# Patient Record
Sex: Male | Born: 1960 | Race: White | Hispanic: No | Marital: Married | Smoking: Never smoker
Health system: Southern US, Community
[De-identification: ages and names within clinical notes are randomized; demographics above are authoritative.]

## PROBLEM LIST (undated history)

## (undated) DIAGNOSIS — J309 Allergic rhinitis, unspecified: Secondary | ICD-10-CM

## (undated) DIAGNOSIS — M109 Gout, unspecified: Secondary | ICD-10-CM

## (undated) DIAGNOSIS — G4733 Obstructive sleep apnea (adult) (pediatric): Secondary | ICD-10-CM

## (undated) DIAGNOSIS — E785 Hyperlipidemia, unspecified: Secondary | ICD-10-CM

## (undated) DIAGNOSIS — I10 Essential (primary) hypertension: Secondary | ICD-10-CM

## (undated) DIAGNOSIS — E7800 Pure hypercholesterolemia, unspecified: Secondary | ICD-10-CM

## (undated) HISTORY — DX: Essential (primary) hypertension: I10

## (undated) HISTORY — DX: Gout, unspecified: M10.9

## (undated) HISTORY — DX: Pure hypercholesterolemia, unspecified: E78.00

## (undated) HISTORY — DX: Obstructive sleep apnea (adult) (pediatric): G47.33

## (undated) HISTORY — PX: SHOULDER SURGERY: SHX246

## (undated) HISTORY — DX: Allergic rhinitis, unspecified: J30.9

## (undated) HISTORY — PX: OTHER SURGICAL HISTORY: SHX169

## (undated) HISTORY — DX: Hyperlipidemia, unspecified: E78.5

---

## 2008-06-15 ENCOUNTER — Encounter: Admission: RE | Admit: 2008-06-15 | Discharge: 2008-06-15 | Payer: Self-pay | Admitting: Family Medicine

## 2008-10-09 ENCOUNTER — Ambulatory Visit (HOSPITAL_COMMUNITY): Admission: RE | Admit: 2008-10-09 | Discharge: 2008-10-09 | Payer: Self-pay | Admitting: Specialist

## 2009-01-28 ENCOUNTER — Ambulatory Visit (HOSPITAL_COMMUNITY): Admission: RE | Admit: 2009-01-28 | Discharge: 2009-01-29 | Payer: Self-pay | Admitting: Specialist

## 2010-07-22 IMAGING — CR DG CHEST 2V
2 series · 2 of 2 positions shown · non-contrast
Comparison: None

CLINICAL DATA: Preop workup

CHEST - 2 VIEW

[w chest pa]
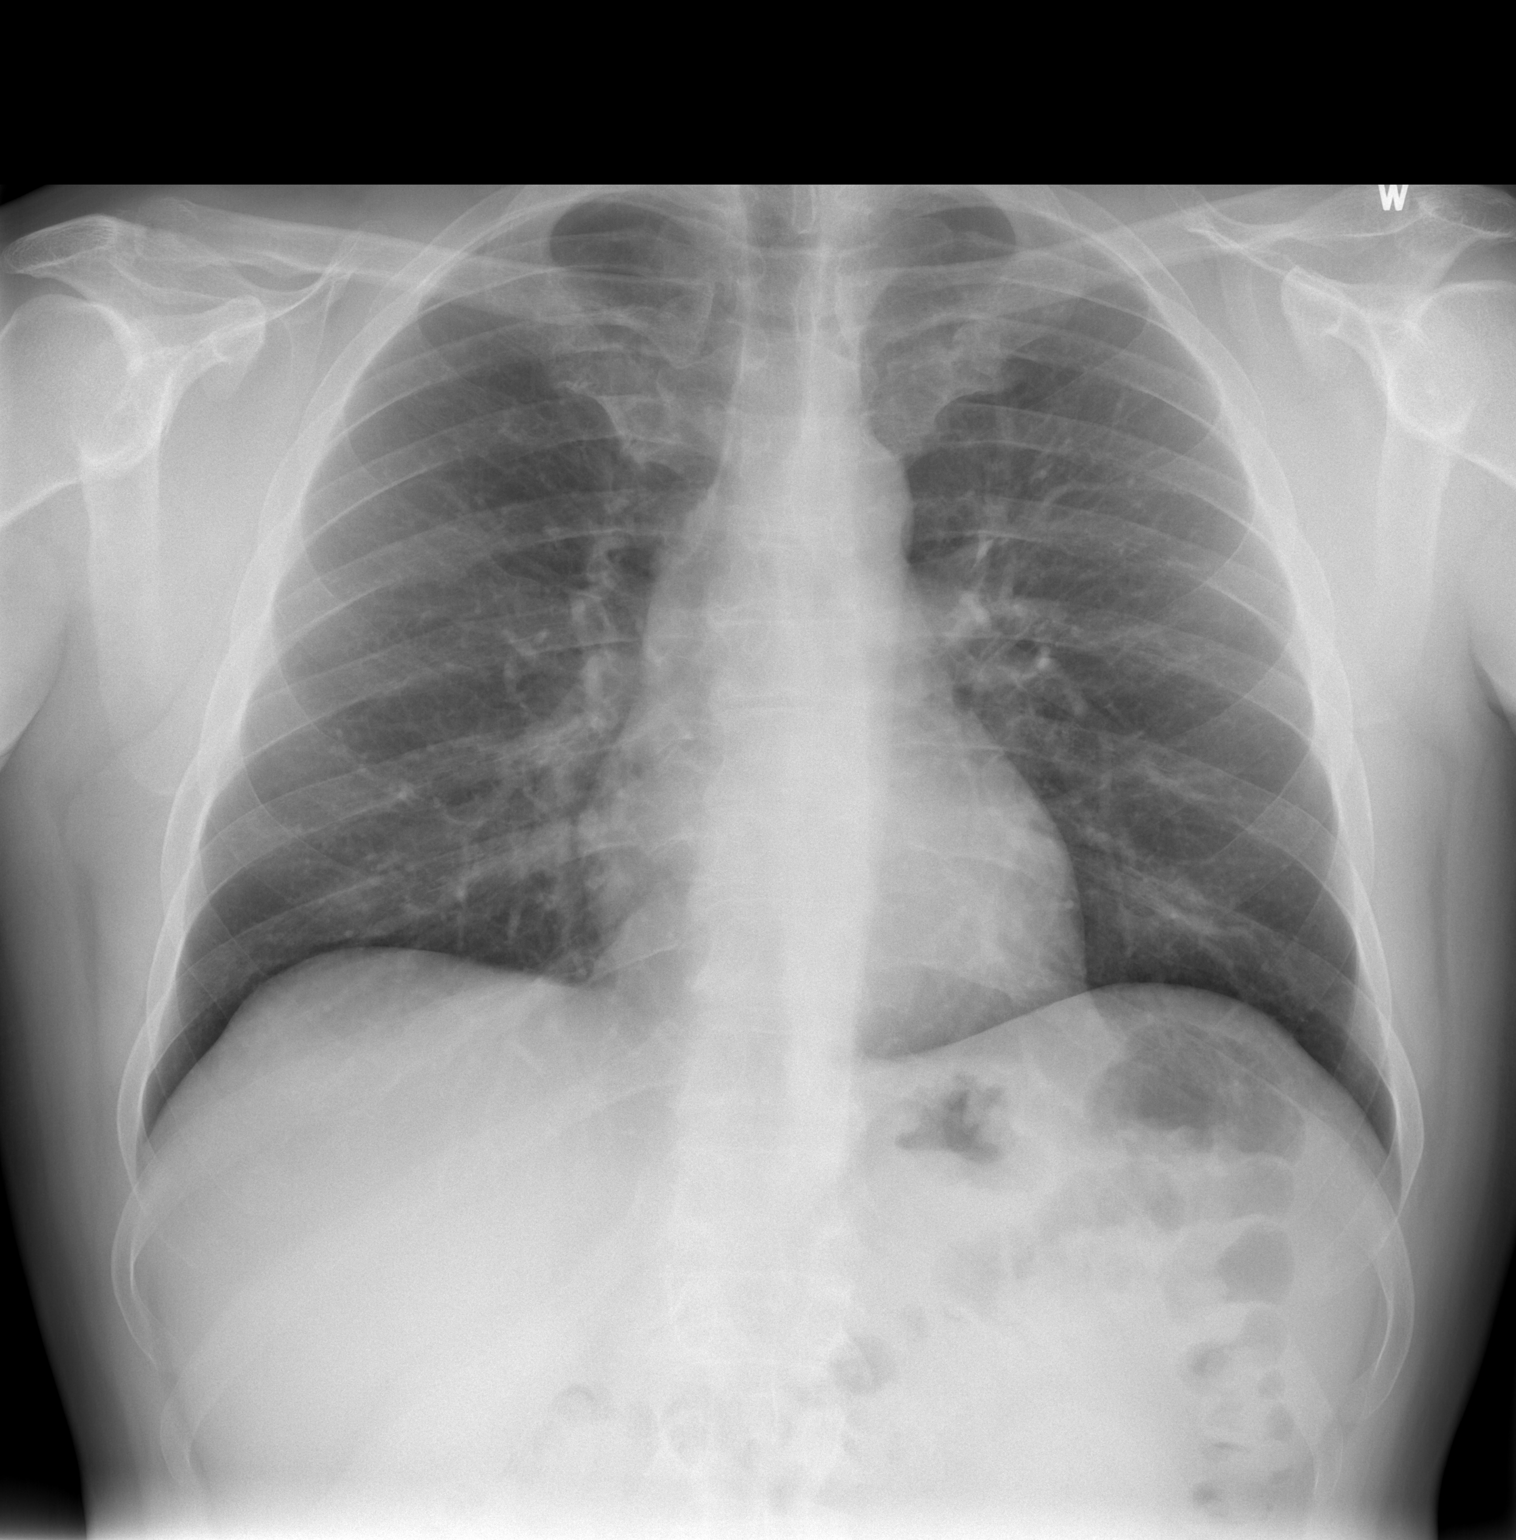

[w chest lat]
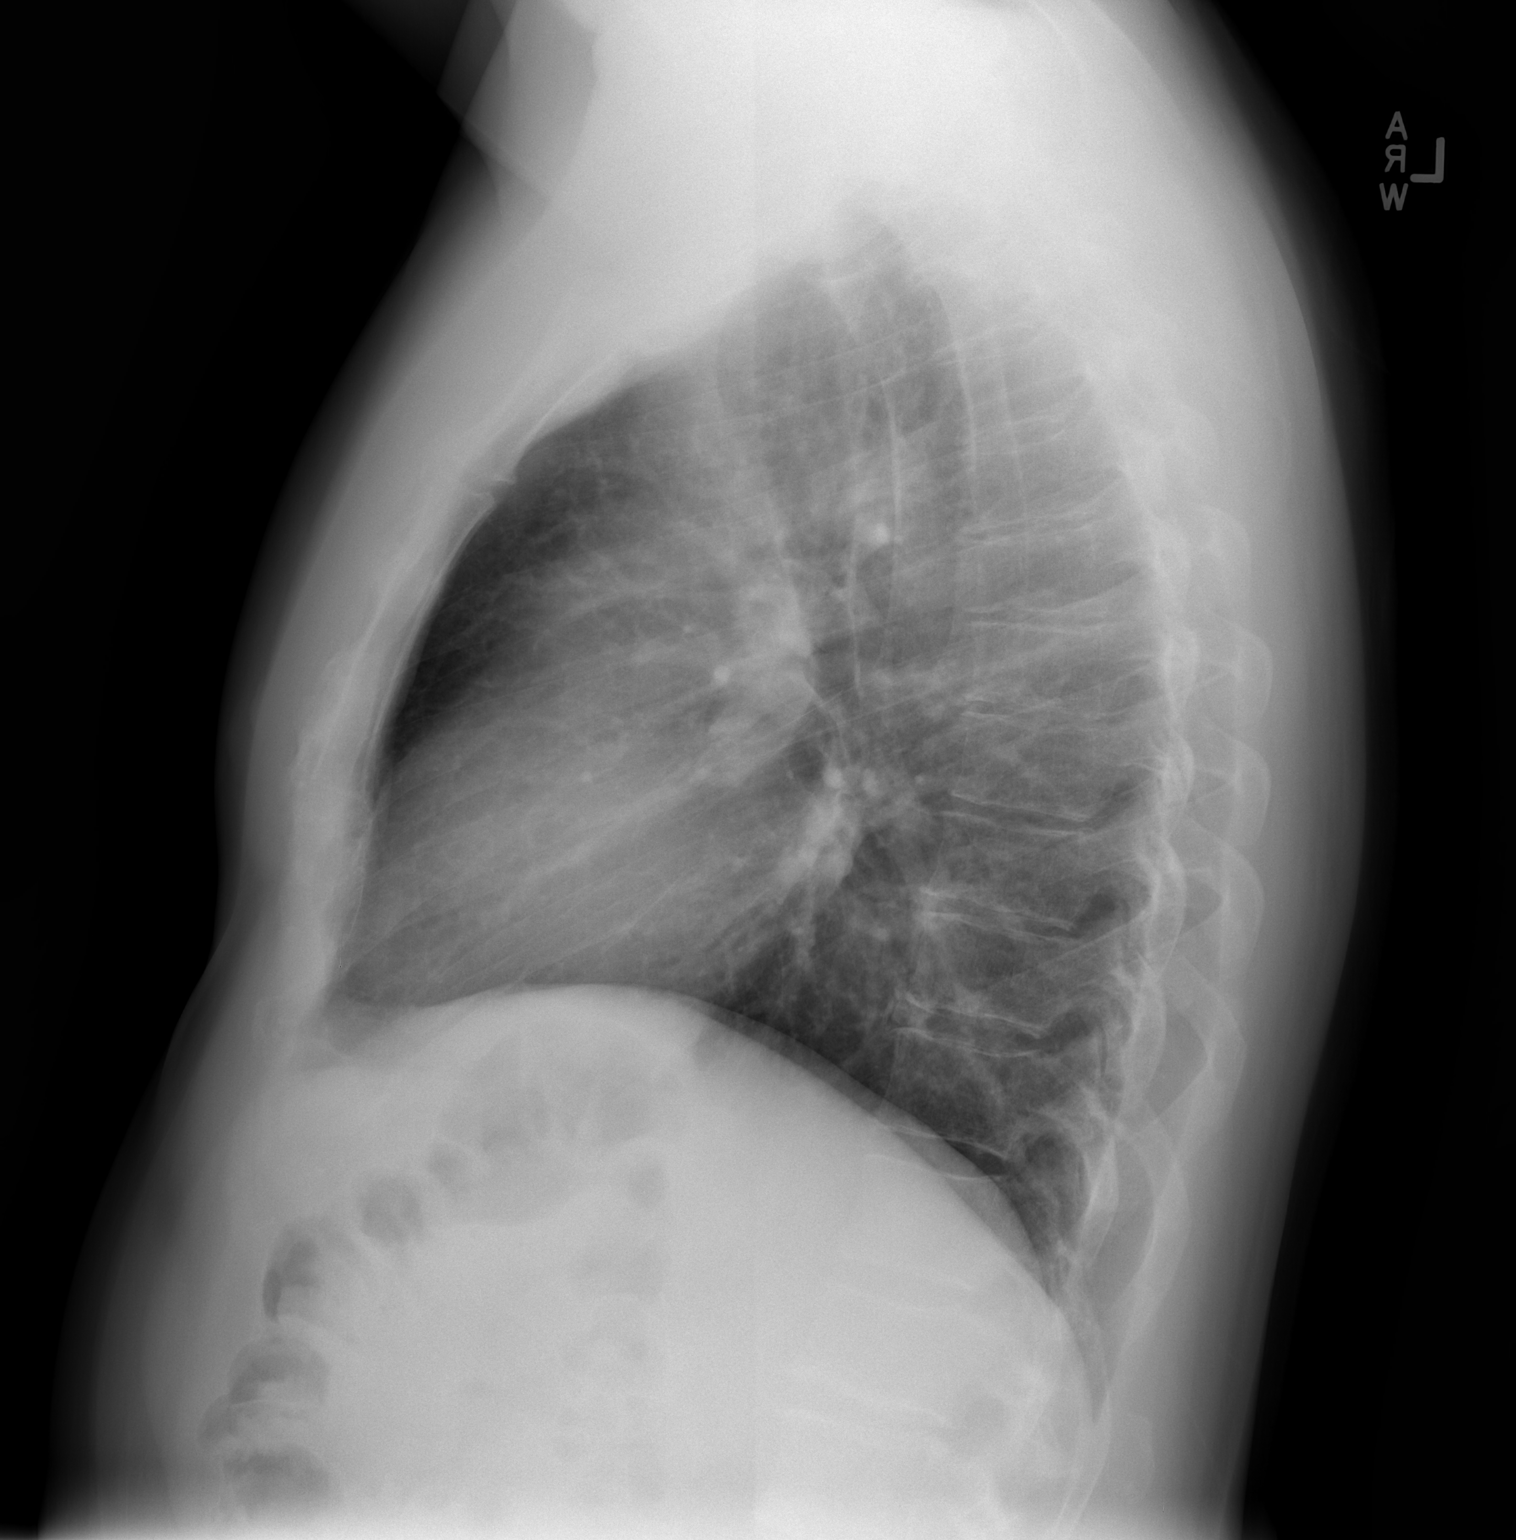

[2 of 2 positions shown; findings below may reference images not displayed]

FINDINGS: The heart size and mediastinal contours are within
normal limits.  Both lungs are clear.  The visualized skeletal
structures are unremarkable.
IMPRESSION: No active cardiopulmonary disease.

## 2010-09-09 LAB — CBC
HCT: 40.3 % (ref 39.0–52.0)
MCHC: 34.8 g/dL (ref 30.0–36.0)
Platelets: 190 10*3/uL (ref 150–400)
RDW: 13.1 % (ref 11.5–15.5)

## 2010-09-09 LAB — BASIC METABOLIC PANEL
BUN: 17 mg/dL (ref 6–23)
Calcium: 9.8 mg/dL (ref 8.4–10.5)
GFR calc non Af Amer: >60 mL/min (ref >60)
Glucose, Bld: 100 mg/dL — ABNORMAL HIGH (ref 70–99)

## 2010-10-17 NOTE — Op Note (Signed)
NAMETYRIEK, HOFMAN NO.:  [id number]   MEDICAL RECORD NO.:  [id number]          PATIENT TYPE:  OIB   LOCATION:  0098                         FACILITY:  [facility]   PHYSICIAN:  Jene Every, M.D.    DATE OF BIRTH:  10/09/1960   DATE OF PROCEDURE:  01/28/2009  DATE OF DISCHARGE:                               OPERATIVE REPORT   PREOPERATIVE DIAGNOSES:  Impingement syndrome, acromioclavicular  arthrosis of the right shoulder.   POSTOPERATIVE DIAGNOSES:  Impingement syndrome, acromioclavicular  arthrosis of the right shoulder.  Glenoid labral tear.   PROCEDURE PERFORMED:  1. Right shoulder arthroscopy.  2. Debridement of glenoid labral tear.  3. Mini open rotator cuff repair of the supraspinatus.  4. Subacromial decompression, bursectomy.  5. Medial and distal clavicle resection.   ANESTHESIA:  General.   ASSISTANT:  Roma Schanz, P.A.   BRIEF HISTORY:  The patient 50 year old with shoulder pain, refractory,  interstitial tear of the rotator cuff, AC arthrosis, refractory to  injections.  Is indicated for arthroscopic evaluation, distal clavicle  resection, possible mini open rotator cuff repair, depending upon  evaluation of the tendon.  Risks and benefits discussed, including  bleeding, infection, injury to vascular structures, suboptimal range of  motion and capsulitis, need for repeat debridement, persistent pain,  etc.   TECHNIQUE:  The patient was brought to the operating room and placed in  beach-chair position.  After induction of adequate general anesthesia, 1  gram of Kefzol, right shoulder and upper extremity were prepped and  draped in the usual sterile fashion.  Full range of motion noted on  exam.  Surgical marker was utilized to delineate the acromion, AC joint  and coracoid.   Standard posterolateral incision was made through the skin and  posterolaterally through the skin only, anterolaterally through the skin  only, and anterior  portal between the coracoid and the acromion, a third  from the anterolateral aspect of the acromion.   With the arm in 07/30 position, we advanced the arthroscopic camera into  the glenohumeral joint, penetrating it atraumatically.  There was  degenerative fraying of the labrum noted.  There was also what appeared  to be tearing of the undersurface of the rotator cuff.  This appeared to  be fairly significant.   We then advanced the cannula under direct visualization beneath the  biceps tendon, penetrating the capsule atraumatically, introduced a  shaver and debrided the labrum to a stable base.  The remainder of the  labrum was intact.  Biceps was intact.  Subscapularis was unremarkable.  Normal cartilage of the glenoid and of the humerus.  Due to what  appeared to be a significant tearing of the rotator cuff, we introduced  and needle from outside the joint through the tendon and through the  tendon.  We then advanced a #1 Prolene through that, removed the needle  and the Prolene was used as an internal and external marker.  I then  redirected the arthroscopic camera into the subacromial space,  triangulating it with a lateral cannula, as well.  In the subacromial  space, hypertrophic and exuberant  bursa was noted and this was debrided  and bursectomy was performed, using an ArthroWand to detach and  morselize the CA ligament.  We used an 18-gauge needle to demarcate the  [facility] joint, and the anterolateral aspect of the acromion.  After  morselizing the CA ligament, we skeletonized the distal clavicle with  the ArthroWand, the acromion in the distal centimeter of that.  We  attempted to deliver it into the subacromial space for arthroscopic  distal clavicle resection, but were unable to do so.  We therefore felt  we would commit to a mini open.   After full bursectomy and inspection, it was felt that there was a  significant, greater than 50% interstitial tear and we decided to   convert to a mini open.  We made a small incision over the anterolateral  aspect of the acromion and made an incision through the raphe.  The  incision was about 1.5 cm.  We subperiosteally elevated the deltoid from  the anterolateral to anteromedial aspect of the acromion.  A small self-  retaining retractor was placed.  We identified the Ethibond suture  through the tendon and removed the suture, marked that point and made an  incision at that point, about 1 cm long, with a 15 blade, debrided the  tendon with the [facility] rongeur and scraped the bone just beneath it with a  curette.  Then we repaired it side-to-side with #1 and 0 Vicryl  interrupted figure-of-eight suture.  Inspection of the remainder of the  subacromial space was essentially unremarkable.   We then made a separate good skin bridge incision over the distal  clavicle, about 1.5 cm in length.  We exposed the distal clavicle, just  incised the capsule over the anterior aspect of it, skeletonized the  remaining portion of distal clavicle, protected that and the tendon.  Using an oscillating saw, we removed 0.5 cm of the distal clavicle.  This was then undercut with a 3-mm Kerrison.  The wound was copiously  irrigated.  We preserved the coracoclavicular ligaments.  We put bone  wax on this clavicle, irrigated and closed the capsule with 0 Vicryl  interrupted figure-of-eight suture and the deltotrapezial fascia with 2-  0 Vicryl simple sutures.  Skin was reapproximated with 4-0 subcuticular  Prolene.   We obtained an intraoperative radiograph due to an end of a suture  needle, which was missing.  It was then found to be in the subacromial  space.  We reinspected the subacromial space.  The needle was  identified, the tip of the needle in its entirety, identical to that  seen on the radiograph and completing the missing portion of the suture  needle.  After this, the wound was copiously irrigated and repaired, the  raphe and the  deltoid with #1 Vicryl interrupted figure-of-eight  sutures, over on top, through the acromion, subcu with 2-0 Vicryl simple  sutures.  Skin was reapproximated with subcuticular Prolene.  Wound  reinforced with Steri-Strips.  Sterile dressing applied.  Portals closed  with 4-0 nylon simple sutures.  Marcaine 0.25% with epinephrine was  infiltrated.  The joint wound was dressed sterilely, placed in an  abduction pillow, extubated without difficulty and transported to the  recovery room in satisfactory condition.   The patient tolerated the procedure with no complications.  Minimal  blood loss.      Jene Every, M.D.  Electronically Signed     JB/MEDQ  D:  01/28/2009  T:  01/28/2009  Job:  [id number]

## 2012-06-16 ENCOUNTER — Other Ambulatory Visit: Payer: Self-pay | Admitting: Family Medicine

## 2012-06-16 DIAGNOSIS — R1310 Dysphagia, unspecified: Secondary | ICD-10-CM

## 2012-06-20 ENCOUNTER — Ambulatory Visit
Admission: RE | Admit: 2012-06-20 | Discharge: 2012-06-20 | Disposition: A | Discharge status: Home / Self Care | Payer: 59 | Source: Ambulatory Visit | Attending: Family Medicine | Admitting: Family Medicine

## 2012-06-20 DIAGNOSIS — R1310 Dysphagia, unspecified: Secondary | ICD-10-CM

## 2013-03-31 ENCOUNTER — Telehealth: Payer: Self-pay | Admitting: Interventional Cardiology

## 2013-03-31 NOTE — Telephone Encounter (Signed)
New problem:  Pt states he is calling for a refill. No answer with out refill team.

## 2013-04-02 ENCOUNTER — Other Ambulatory Visit: Payer: Self-pay

## 2013-04-02 MED ORDER — COLESEVELAM HCL 625 MG PO TABS: 1875.0000 mg | ORAL_TABLET | Freq: Two times a day (BID) | ORAL | Status: DC

## 2013-04-02 MED ORDER — FENOFIBRATE 160 MG PO TABS: 160.0000 mg | ORAL_TABLET | Freq: Every day | ORAL | Status: DC

## 2013-04-02 MED ORDER — COLESEVELAM HCL 625 MG PO TABS: ORAL_TABLET | ORAL | Status: DC

## 2013-04-16 ENCOUNTER — Encounter: Payer: Self-pay | Admitting: *Deleted

## 2013-04-16 ENCOUNTER — Encounter: Payer: Self-pay | Admitting: Interventional Cardiology

## 2013-04-17 ENCOUNTER — Encounter: Payer: Self-pay | Admitting: Interventional Cardiology

## 2013-04-17 ENCOUNTER — Ambulatory Visit (INDEPENDENT_AMBULATORY_CARE_PROVIDER_SITE_OTHER): Payer: 59 | Admitting: Interventional Cardiology

## 2013-04-17 VITALS — BP 125/77 | HR 89 | Ht 69.0 in | Wt 188.4 lb

## 2013-04-17 DIAGNOSIS — I10 Essential (primary) hypertension: Secondary | ICD-10-CM | POA: Insufficient documentation

## 2013-04-17 DIAGNOSIS — E782 Mixed hyperlipidemia: Secondary | ICD-10-CM

## 2013-04-17 DIAGNOSIS — F17200 Nicotine dependence, unspecified, uncomplicated: Secondary | ICD-10-CM

## 2013-04-17 MED ORDER — COLESEVELAM HCL 625 MG PO TABS: ORAL_TABLET | ORAL | Status: DC

## 2013-04-17 MED ORDER — FENOFIBRATE 160 MG PO TABS: 160.0000 mg | ORAL_TABLET | Freq: Every day | ORAL | Status: DC

## 2013-04-17 NOTE — Patient Instructions (Signed)
Your physician discussed the importance of regular exercise and recommended that you start or continue a regular exercise program for good health. Try to exercise 5 days a week for at least 30 minutes.  Try to decrease alcohol intake as much as possible. No more than one 6 pack a week.  Your physician wants you to follow-up in: 1 year with Dr. Eldridge Dace. You will receive a reminder letter in the mail two months in advance. If you don't receive a letter, please call our office to schedule the follow-up appointment.

## 2013-04-17 NOTE — Progress Notes (Signed)
Patient ID: Martin Sims, male   DOB: [birth year], 52 y.o.   MRN: [id number]    [address] [place], [place]  [address] Phone: [phone] Fax:  [phone]  Date:  04/17/2013   ID:  Martin Sims, DOB [birth year], MRN [id number]  PCP:  No primary provider on file.      History of Present Illness: Martin Sims is a 52 y.o. male who has had hyperlipidemia and HTN. He has not been exercising regularly. His most strenuous activity going up stairs. No problems with this.  Occasionaly goes to the gym and does cardio and weghts wihtout a problem.  He uses CPAP regularly. Hypertension:  Denies : Chest pain.  Dizziness.  Leg edema.  Orthopnea.  Paroxysmal nocturnal dyspnea.  Palpitations.  Syncope.     Wt Readings from Last 3 Encounters:  04/17/13 188 lb 6.4 oz (85.458 kg)     Past Medical History  Diagnosis Date  . Gout   . Hyperlipidemia   . HTN (hypertension)   . Hypercholesteremia   . OSA (obstructive sleep apnea)     PSG 07/24/09 ESS 13, AHI 34/hr REM 29/hr, RDI 34/hr REM 29/hr, O2 min 84%; CPAP 08/17/09 CPAP 7 with AHI 0)  . Allergic rhinitis     Current Outpatient Prescriptions  Medication Sig Dispense Refill  . amLODipine (NORVASC) 5 MG tablet Take 5 mg by mouth daily.       . colesevelam (WELCHOL) 625 MG tablet Take 2 tablets by mouth twice a day  120 tablet  0  . fenofibrate 160 MG tablet Take 1 tablet (160 mg total) by mouth daily.  30 tablet  3  . losartan (COZAAR) 50 MG tablet Take 50 mg by mouth daily.       . niacin (NIASPAN) 1000 MG CR tablet Take 1,000 mg by mouth at bedtime.       [name] Kitchen omega-3 acid ethyl esters (LOVAZA) 1 G capsule Take 1 g by mouth daily.        No current facility-administered medications for this visit.    Allergies:   No Known Allergies  Social History:  The patient  reports that he has never smoked. He does not have any smokeless tobacco history on file. He reports that he does not use illicit drugs.   Family  History:  The patient's family history includes CAD in his father.   ROS:  Please see the history of present illness.  No nausea, vomiting.  No fevers, chills.  No focal weakness.  No dysuria.    All other systems reviewed and negative.   PHYSICAL EXAM: VS:  BP 125/77  Pulse 89  Ht 5\' 9"  (1.753 m)  Wt 188 lb 6.4 oz (85.458 kg)  BMI 27.81 kg/m2 Well nourished, well developed, in no acute distress HEENT: normal Neck: no JVD, no carotid bruits Cardiac:  normal S1, S2; RRR;  Lungs:  clear to auscultation bilaterally, no wheezing, rhonchi or rales Abd: soft, nontender, no hepatomegaly Ext: no edema Skin: warm and dry Neuro:   no focal abnormalities noted  EKG:  Sinus bradycardia, no significant ST changes; LAD     ASSESSMENT AND PLAN:  Essential hypertension, benign  Continue Losartan Potassium Tablet, 50 MG, 1 tablet, Orally, once a day Continue Amlodipine Besylate tablet, 5MG , 1 tablet, Orally, once a day BP controlled at home and work.    2. Mixed hyperlipidemia  Continue Fenofibrate Tablet, 160 MG, 1 tablet, Orally, Once a day  Continue Lovaza Capsule, 1GM, 2 capsules, Orally, Twice a day Continue WelChol Tablet, 625 MG, 2 tablets, Orally, twice a day Continue Niaspan Tablet Extended Release, 1000 MG, 2 tablets at bedtime, Orally, at bedtime Intolerant of statins due to increased LFTs. Lipids checked today.  Asked him to cut down on his alcohol intake. Currently, he drinks 12-18 beers a week. He is to cut down to a maximum of 6 per week. I wonder if some of the elevated LFTs that he had in the past with statins was also due to alcohol consumption.  Discussed with [employer], Pharm.D. Labs to be rechecked in about 2 months.    3. tobacco abuse: I recommend that he stop using chewing tobacco. He is trying to quit.    Diagnostic Imaging  EKG Harward,Sims 04/15/2012 08:52:29 AM > Sims,Martin 04/15/2012 09:07:32 AM > NSR, LAFB, no ST segment changes. RBBB from prior ECG has  resolved.   Preventive Medicine  Adult topics discussed:  Diet: healthy diet, discussed a low fat, low cholesterol, low sugar diet.  Exercise: at least 30 minutes of aerobic exercise, 5 days a week.   avoid chewing tobacco.      Signed, Martin Mare, MD, [facility] 04/17/2013 11:47 AM

## 2013-04-27 ENCOUNTER — Other Ambulatory Visit: Payer: Self-pay | Admitting: *Deleted

## 2013-04-27 DIAGNOSIS — Z79899 Other long term (current) drug therapy: Secondary | ICD-10-CM

## 2013-04-27 DIAGNOSIS — E782 Mixed hyperlipidemia: Secondary | ICD-10-CM

## 2013-06-08 ENCOUNTER — Other Ambulatory Visit: Payer: 59

## 2013-06-17 ENCOUNTER — Other Ambulatory Visit (INDEPENDENT_AMBULATORY_CARE_PROVIDER_SITE_OTHER): Payer: 59

## 2013-06-17 DIAGNOSIS — Z79899 Other long term (current) drug therapy: Secondary | ICD-10-CM

## 2013-06-17 DIAGNOSIS — E782 Mixed hyperlipidemia: Secondary | ICD-10-CM

## 2013-06-17 LAB — ALT: ALT: 30 U/L (ref 0–53)

## 2013-06-18 LAB — NMR LIPOPROFILE WITH LIPIDS
CHOLESTEROL, TOTAL: 175 mg/dL (ref <200)
HDL PARTICLE NUMBER: 36.6 umol/L (ref >=30.5)
HDL Size: 8.4 nm — ABNORMAL LOW (ref >=9.2)
HDL-C: 44 mg/dL (ref >=40)
LARGE VLDL-P: 9.5 nmol/L — AB (ref <=2.7)
LDL (calc): 79 mg/dL (ref <100)
LDL PARTICLE NUMBER: 1628 nmol/L — AB (ref <1000)
LDL Size: 19.8 nm — ABNORMAL LOW (ref >20.5)
LP-IR SCORE: 84 — AB (ref <=45)
Large HDL-P: 1.7 umol/L — ABNORMAL LOW (ref >=4.8)
Small LDL Particle Number: 1231 nmol/L — ABNORMAL HIGH (ref <=527)
TRIGLYCERIDES: 260 mg/dL — AB (ref <150)
VLDL SIZE: 52 nm — AB (ref <=46.6)

## 2013-06-25 ENCOUNTER — Telehealth: Payer: Self-pay | Admitting: Pharmacist

## 2013-06-25 DIAGNOSIS — Z79899 Other long term (current) drug therapy: Secondary | ICD-10-CM

## 2013-06-25 DIAGNOSIS — E785 Hyperlipidemia, unspecified: Secondary | ICD-10-CM

## 2013-06-25 MED ORDER — ROSUVASTATIN CALCIUM 10 MG PO TABS: ORAL_TABLET | ORAL | Status: DC

## 2013-06-25 NOTE — Telephone Encounter (Signed)
Discussed his lipid panel from this month with him over the phone:  RF: Family h/o CAD, skoal use (tobacco), HTN, age - LDL goal < 130 at least, LDL-P goal < 1300.  Meds: Fenofibrate 160 mg qd, Niaspan 2000 mg qhs, Lovaza 4 g/d, Welchol 625 mg 4 tablets daily, CholestOff 2 per day.  Avoding statins due to h/o LFT elevations, and failed Zetia  In past (muscle aches in his amrs).  Patient uses snuff and drinks 12-18 beers per week. Not using statins in patient given h/o LFT elevation, however LFTs have been normal for years.  Cholesterol trending up on this regimen. I discussed options with patient, and since cholesterol worsening, and LFTs normal, will try to add Crestor 10 mg twice weekly and stop Welchol.  Welchol may be contributing to his elevated TG, which could be leading to elevated LDL-P.  His beer consumption could also be doing this. He is taking fenofibrate without food currently. Plan: 1.  Patient notified to continue Lovaza 4 g/d and niaspan 2 g qhs. 2.  Patient to add Crestor 10 mg twice weekly.  Starting low dose given h/o elevated LFTs 3.  He will also move his fenofibrate to his largest meal of the day to increase absorption. 4.  He will take his Cholestoff twice daily prior to meals. 5.  Recheck NMR LipoProfile and hepatic panel in 2 months (09/09/13).  If cholesterol no better I will call and have him come in to see me in clinic.  To Dr. Irish Lack as FYI only.

## 2013-06-30 ENCOUNTER — Telehealth: Payer: Self-pay | Admitting: *Deleted

## 2013-06-30 NOTE — Telephone Encounter (Signed)
PA to [employer] for crestor

## 2013-07-09 NOTE — Telephone Encounter (Signed)
[facility] approved Crestor for 2 years, patient notified via message on 2 phones.

## 2013-08-17 ENCOUNTER — Other Ambulatory Visit: Payer: Self-pay

## 2013-08-17 MED ORDER — OMEGA-3-ACID ETHYL ESTERS 1 G PO CAPS: 2.0000 g | ORAL_CAPSULE | Freq: Two times a day (BID) | ORAL | Status: DC

## 2013-09-09 ENCOUNTER — Other Ambulatory Visit: Payer: 59

## 2013-09-17 NOTE — Telephone Encounter (Signed)
Pt was seen 11/14 and refills were addressed.

## 2013-09-30 ENCOUNTER — Other Ambulatory Visit (INDEPENDENT_AMBULATORY_CARE_PROVIDER_SITE_OTHER): Payer: 59

## 2013-09-30 DIAGNOSIS — Z79899 Other long term (current) drug therapy: Secondary | ICD-10-CM

## 2013-09-30 DIAGNOSIS — E785 Hyperlipidemia, unspecified: Secondary | ICD-10-CM

## 2013-09-30 LAB — HEPATIC FUNCTION PANEL
ALBUMIN: 4 g/dL (ref 3.5–5.2)
ALT: 32 U/L (ref 0–53)
AST: 30 U/L (ref 0–37)
Alkaline Phosphatase: 33 U/L — ABNORMAL LOW (ref 39–117)
Bilirubin, Direct: 0.1 mg/dL (ref 0.0–0.3)
Total Bilirubin: 0.6 mg/dL (ref 0.3–1.2)
Total Protein: 6.6 g/dL (ref 6.0–8.3)

## 2013-10-01 LAB — NMR LIPOPROFILE WITH LIPIDS
Cholesterol, Total: 131 mg/dL (ref <200)
HDL Particle Number: 37.6 umol/L (ref >=30.5)
HDL Size: 8.2 nm — ABNORMAL LOW (ref >=9.2)
HDL-C: 47 mg/dL (ref >=40)
LDL CALC: 48 mg/dL (ref <100)
LDL PARTICLE NUMBER: 1212 nmol/L — AB (ref <1000)
LDL SIZE: 19.6 nm — AB (ref >20.5)
LP-IR SCORE: 80 — AB (ref <=45)
Large HDL-P: <1.3 umol/L — ABNORMAL LOW (ref >=4.8)
Large VLDL-P: 6.2 nmol/L — ABNORMAL HIGH (ref <=2.7)
SMALL LDL PARTICLE NUMBER: 1015 nmol/L — AB (ref <=527)
Triglycerides: 181 mg/dL — ABNORMAL HIGH (ref <150)
VLDL SIZE: 51.2 nm — AB (ref <=46.6)

## 2013-10-06 ENCOUNTER — Other Ambulatory Visit: Payer: Self-pay | Admitting: Cardiology

## 2013-10-06 DIAGNOSIS — E782 Mixed hyperlipidemia: Secondary | ICD-10-CM

## 2013-10-06 MED ORDER — NIACIN ER (ANTIHYPERLIPIDEMIC) 1000 MG PO TBCR: 2000.0000 mg | EXTENDED_RELEASE_TABLET | Freq: Every day | ORAL | Status: DC

## 2013-10-06 MED ORDER — ROSUVASTATIN CALCIUM 10 MG PO TABS: ORAL_TABLET | ORAL | Status: DC

## 2013-12-08 ENCOUNTER — Other Ambulatory Visit: Payer: Self-pay | Admitting: *Deleted

## 2013-12-08 MED ORDER — FENOFIBRATE 160 MG PO TABS: 160.0000 mg | ORAL_TABLET | Freq: Every day | ORAL | Status: DC

## 2014-04-08 ENCOUNTER — Other Ambulatory Visit: Payer: 59

## 2014-05-06 ENCOUNTER — Other Ambulatory Visit (INDEPENDENT_AMBULATORY_CARE_PROVIDER_SITE_OTHER): Payer: 59 | Admitting: *Deleted

## 2014-05-06 ENCOUNTER — Ambulatory Visit (INDEPENDENT_AMBULATORY_CARE_PROVIDER_SITE_OTHER): Payer: 59 | Admitting: Interventional Cardiology

## 2014-05-06 ENCOUNTER — Encounter: Payer: Self-pay | Admitting: Interventional Cardiology

## 2014-05-06 VITALS — BP 138/90 | HR 72 | Ht 69.0 in | Wt 191.0 lb

## 2014-05-06 DIAGNOSIS — E782 Mixed hyperlipidemia: Secondary | ICD-10-CM

## 2014-05-06 DIAGNOSIS — I10 Essential (primary) hypertension: Secondary | ICD-10-CM

## 2014-05-06 LAB — HEPATIC FUNCTION PANEL
ALT: 45 U/L (ref 0–53)
AST: 40 U/L — AB (ref 0–37)
Albumin: 4.1 g/dL (ref 3.5–5.2)
Alkaline Phosphatase: 40 U/L (ref 39–117)
Bilirubin, Direct: 0 mg/dL (ref 0.0–0.3)
TOTAL PROTEIN: 6.6 g/dL (ref 6.0–8.3)
Total Bilirubin: 0.9 mg/dL (ref 0.2–1.2)

## 2014-05-06 NOTE — Patient Instructions (Signed)
Your physician recommends that you continue on your current medications as directed. Please refer to the Current Medication list given to you today.  Your physician wants you to follow-up in: 1 year with Dr. Varanasi. You will receive a reminder letter in the mail two months in advance. If you don't receive a letter, please call our office to schedule the follow-up appointment.  

## 2014-05-06 NOTE — Progress Notes (Signed)
Patient ID: Martin Sims, male   DOB: 1960-12-27, 53 y.o.   MRN: [id number] Patient ID: Martin Sims, male   DOB: 1961/01/02, 53 y.o.   MRN: [id number]    [place], [place], [place]  [address] Phone: [phone] Fax:  [phone]  Date:  05/06/2014   ID:  Martin Sims, DOB [birth year], MRN [id number]  PCP:  Marjorie Smolder, MD      History of Present Illness: Martin Sims is a 53 y.o. male who has had hyperlipidemia and HTN. He has not been exercising regularly recently due to a cold in November 2015.  Prior to this, he was doing more exercise.  He changed to dayshift but still has not gotten into a regular routine.  He works in [place]. His most strenuous activity going up stairs. No problems with this. Previously, he would go to the gym and do cardio and weights wihtout a problem.   He plans to restart exercise He uses CPAP regularly and tolerates it well. Hypertension:  Denies : Chest pain.  Dizziness.  Leg edema.  Orthopnea.  Paroxysmal nocturnal dyspnea.  Palpitations.  Syncope.     Wt Readings from Last 3 Encounters:  05/06/14 191 lb (86.637 kg)  04/17/13 188 lb 6.4 oz (85.458 kg)     Past Medical History  Diagnosis Date  . Gout   . Hyperlipidemia   . HTN (hypertension)   . Hypercholesteremia   . OSA (obstructive sleep apnea)     PSG 07/24/09 ESS 13, AHI 34/hr REM 29/hr, RDI 34/hr REM 29/hr, O2 min 84%; CPAP 08/17/09 CPAP 7 with AHI 0)  . Allergic rhinitis     Current Outpatient Prescriptions  Medication Sig Dispense Refill  . amLODipine (NORVASC) 5 MG tablet Take 5 mg by mouth daily.     . fenofibrate 160 MG tablet Take 1 tablet (160 mg total) by mouth daily. 14 tablet 0  . losartan (COZAAR) 50 MG tablet Take 50 mg by mouth daily.     . niacin (NIASPAN) 1000 MG CR tablet Take 2 tablets (2,000 mg total) by mouth at bedtime. 180 tablet 2  . omega-3 acid ethyl esters (LOVAZA) 1 G capsule Take 2 capsules (2 g total) by mouth 2 (two) times  daily. 30 capsule 0  . rosuvastatin (CRESTOR) 10 MG tablet Take 1 tablet twice weekly 30 tablet 3   No current facility-administered medications for this visit.    Allergies:   No Known Allergies  Social History:  The patient  reports that he has never smoked. His smokeless tobacco use includes Chew. He reports that he drinks alcohol. He reports that he does not use illicit drugs.   Family History:  The patient's family history includes CAD in his father.   ROS:  Please see the history of present illness.  No nausea, vomiting.  No fevers, chills.  No focal weakness.  No dysuria.    All other systems reviewed and negative.   PHYSICAL EXAM: VS:  BP 138/90 mmHg  Pulse 72  Ht 5\' 9"  (1.753 m)  Wt 191 lb (86.637 kg)  BMI 28.19 kg/m2 Well nourished, well developed, in no acute distress HEENT: normal Neck: no JVD, no carotid bruits Cardiac:  normal S1, S2; RRR;  Lungs:  clear to auscultation bilaterally, no wheezing, rhonchi or rales Abd: soft, nontender, no hepatomegaly Ext: no edema Skin: warm and dry Neuro:   no focal abnormalities noted Psych: normal affect  EKG:  Sinus  bradycardia, no significant ST changes; LAD     ASSESSMENT AND PLAN:  Essential hypertension, benign  Continue Losartan Potassium Tablet, 50 MG, 1 tablet, Orally, once a day Continue Amlodipine Besylate tablet, 5MG , 1 tablet, Orally, once a day BP controlled at home and work.    2. Mixed hyperlipidemia  Continue Fenofibrate Tablet, 160 MG, 1 tablet, Orally, Once a day Continue Lovaza Capsule, 1GM, 2 capsules, Orally, Twice a day Stopped WelChol Tablet, 625 MG, 2 tablets, Orally, twice a day Continue Niaspan Tablet Extended Release, 1000 MG, 2 tablets at bedtime, Orally, at bedtime Intolerant of statins in the past due to increased LFTs.  Now tolerating twice a week Crestor. Lipids checked today.  Asked him to cut down on his alcohol intake.He is to cut down to a maximum of 6 per week. I wonder if some of the  elevated LFTs that he had in the past with statins was also due to alcohol consumption.     3. tobacco abuse: I recommend that he stop using chewing tobacco. He is trying to quit.         Preventive Medicine  Adult topics discussed:  Diet: healthy diet, discussed a low fat, low cholesterol, low sugar diet.  Exercise: at least 30 minutes of aerobic exercise, 5 days a week.   avoid chewing tobacco.      Signed, Mina Marble, MD, [facility] 05/06/2014 9:28 AM

## 2014-05-08 LAB — NMR LIPOPROFILE WITH LIPIDS
Cholesterol, Total: 186 mg/dL (ref 100–199)
HDL PARTICLE NUMBER: 37.1 umol/L (ref >=30.5)
HDL Size: 8.6 nm — ABNORMAL LOW (ref >=9.2)
HDL-C: 38 mg/dL — ABNORMAL LOW (ref >39)
LARGE HDL: 1.6 umol/L — AB (ref >=4.8)
LDL (calc): 97 mg/dL (ref 0–99)
LDL Particle Number: 1903 nmol/L — ABNORMAL HIGH (ref <1000)
LDL SIZE: 19.8 nm (ref >=20.8)
LP-IR Score: 79 — ABNORMAL HIGH (ref <=45)
Large VLDL-P: 7.9 nmol/L — ABNORMAL HIGH (ref <=2.7)
Small LDL Particle Number: 1458 nmol/L — ABNORMAL HIGH (ref <=527)
Triglycerides: 254 mg/dL — ABNORMAL HIGH (ref 0–149)
VLDL Size: 48.3 nm — ABNORMAL HIGH (ref <=46.6)

## 2014-05-24 ENCOUNTER — Other Ambulatory Visit: Payer: Self-pay

## 2014-05-24 DIAGNOSIS — E782 Mixed hyperlipidemia: Secondary | ICD-10-CM

## 2014-06-14 ENCOUNTER — Other Ambulatory Visit: Payer: Self-pay | Admitting: *Deleted

## 2014-06-14 ENCOUNTER — Telehealth: Payer: Self-pay | Admitting: Interventional Cardiology

## 2014-06-14 MED ORDER — ROSUVASTATIN CALCIUM 10 MG PO TABS: ORAL_TABLET | ORAL | Status: DC

## 2014-06-14 NOTE — Telephone Encounter (Signed)
Left voicemail on home phone letting pt know that prescription was sent to pharmacy.

## 2014-06-14 NOTE — Telephone Encounter (Signed)
New Msg        Pt calling to see if he can get a 90 day prescription written for Crestor.    [facility] [place]    Please call pt if any concerns.

## 2014-07-14 ENCOUNTER — Other Ambulatory Visit: Payer: Self-pay

## 2014-07-14 MED ORDER — FENOFIBRATE 160 MG PO TABS: 160.0000 mg | ORAL_TABLET | Freq: Every day | ORAL | Status: DC

## 2014-07-14 MED ORDER — NIACIN ER (ANTIHYPERLIPIDEMIC) 1000 MG PO TBCR: 2000.0000 mg | EXTENDED_RELEASE_TABLET | Freq: Every day | ORAL | Status: DC

## 2014-07-14 MED ORDER — OMEGA-3-ACID ETHYL ESTERS 1 G PO CAPS: 2.0000 g | ORAL_CAPSULE | Freq: Two times a day (BID) | ORAL | Status: DC

## 2014-11-17 ENCOUNTER — Other Ambulatory Visit: Payer: 59

## 2014-11-26 ENCOUNTER — Other Ambulatory Visit (INDEPENDENT_AMBULATORY_CARE_PROVIDER_SITE_OTHER): Payer: 59 | Admitting: *Deleted

## 2014-11-26 DIAGNOSIS — E782 Mixed hyperlipidemia: Secondary | ICD-10-CM

## 2014-11-26 LAB — HEPATIC FUNCTION PANEL
ALT: 32 U/L (ref 0–53)
AST: 30 U/L (ref 0–37)
Albumin: 4.3 g/dL (ref 3.5–5.2)
Alkaline Phosphatase: 38 U/L — ABNORMAL LOW (ref 39–117)
BILIRUBIN TOTAL: 0.7 mg/dL (ref 0.2–1.2)
Bilirubin, Direct: 0.1 mg/dL (ref 0.0–0.3)
TOTAL PROTEIN: 6.8 g/dL (ref 6.0–8.3)

## 2014-11-29 LAB — NMR LIPOPROFILE WITH LIPIDS
Cholesterol, Total: 130 mg/dL (ref 100–199)
HDL Particle Number: 32.8 umol/L (ref >=30.5)
HDL Size: 8.3 nm — ABNORMAL LOW (ref >=9.2)
HDL-C: 37 mg/dL — AB (ref >39)
LDL (calc): 55 mg/dL (ref 0–99)
LDL PARTICLE NUMBER: 1109 nmol/L — AB (ref <1000)
LDL SIZE: 19.9 nm (ref >=20.8)
LP-IR Score: 75 — ABNORMAL HIGH (ref <=45)
Large VLDL-P: 5.5 nmol/L — ABNORMAL HIGH (ref <=2.7)
Small LDL Particle Number: 760 nmol/L — ABNORMAL HIGH (ref <=527)
Triglycerides: 191 mg/dL — ABNORMAL HIGH (ref 0–149)
VLDL Size: 47.9 nm — ABNORMAL HIGH (ref <=46.6)

## 2015-02-25 ENCOUNTER — Encounter: Payer: Self-pay | Admitting: Interventional Cardiology

## 2015-06-01 ENCOUNTER — Other Ambulatory Visit: Payer: Self-pay | Admitting: Interventional Cardiology

## 2015-07-06 ENCOUNTER — Encounter: Payer: Self-pay | Admitting: Interventional Cardiology

## 2015-07-15 ENCOUNTER — Other Ambulatory Visit (INDEPENDENT_AMBULATORY_CARE_PROVIDER_SITE_OTHER): Payer: 59 | Admitting: *Deleted

## 2015-07-15 DIAGNOSIS — E782 Mixed hyperlipidemia: Secondary | ICD-10-CM

## 2015-07-15 DIAGNOSIS — F17200 Nicotine dependence, unspecified, uncomplicated: Secondary | ICD-10-CM

## 2015-07-15 DIAGNOSIS — I10 Essential (primary) hypertension: Secondary | ICD-10-CM

## 2015-07-15 LAB — HEPATIC FUNCTION PANEL
ALBUMIN: 4.2 g/dL (ref 3.6–5.1)
ALT: 35 U/L (ref 9–46)
AST: 32 U/L (ref 10–35)
Alkaline Phosphatase: 36 U/L — ABNORMAL LOW (ref 40–115)
BILIRUBIN DIRECT: 0.2 mg/dL (ref <=0.2)
BILIRUBIN TOTAL: 0.8 mg/dL (ref 0.2–1.2)
Indirect Bilirubin: 0.6 mg/dL (ref 0.2–1.2)
Total Protein: 6.8 g/dL (ref 6.1–8.1)

## 2015-07-15 NOTE — Progress Notes (Signed)
Patient ID: Martin Sims, male   DOB: [birth year], 55 y.o.   MRN: [id number]     Cardiology Office Note   Date:  07/18/2015   ID:  Martin Sims, DOB 06-22-1960, MRN [id number]  PCP:  Martin Smolder, MD    No chief complaint on file.    Wt Readings from Last 3 Encounters:  07/18/15 189 lb (85.73 kg)  05/06/14 191 lb (86.637 kg)  04/17/13 188 lb 6.4 oz (85.458 kg)       History of Present Illness: Martin Sims is a 55 y.o. male  who has had hyperlipidemia and HTN. He has not been exercising regularly in 2017 due to some personal circumstances.  He changed to dayshift but then went back to second shift due to traffic. He works in [place]. His most strenuous activity going up stairs. No problems with this.Not going to the gym and doing cardio and weights like he did before. He plans to restart exercise He uses CPAP regularly and tolerates it well. Hypertension:  Denies : Chest pain.  Dizziness.  Leg edema.  Orthopnea.  Paroxysmal nocturnal dyspnea.  Palpitations.  Syncope.   Overall, he feels that he is ok, but too inactive.    Past Medical History  Diagnosis Date  . Gout   . Hyperlipidemia   . HTN (hypertension)   . Hypercholesteremia   . OSA (obstructive sleep apnea)     PSG 07/24/09 ESS 13, AHI 34/hr REM 29/hr, RDI 34/hr REM 29/hr, O2 min 84%; CPAP 08/17/09 CPAP 7 with AHI 0)  . Allergic rhinitis     Past Surgical History  Procedure Laterality Date  . Shoulder surgery    . Septoplasty and bilateral turbinate reduction- dr. Janace Hoard       Current Outpatient Prescriptions  Medication Sig Dispense Refill  . amLODipine (NORVASC) 5 MG tablet Take 5 mg by mouth daily.     . fenofibrate 160 MG tablet Take 1 tablet (160 mg total) by mouth daily. 90 tablet 3  . losartan (COZAAR) 50 MG tablet Take 50 mg by mouth daily.     . niacin (NIASPAN) 1000 MG CR tablet TAKE 2 TABLETS BY MOUTH AT BEDTIME 60 tablet 2  . omega-3 acid ethyl esters (LOVAZA) 1 g capsule  TAKE 2 CAPSULES BY MOUTH TWICE A DAY 120 capsule 2  . rosuvastatin (CRESTOR) 10 MG tablet Take 1 tablet twice weekly 30 tablet 3   No current facility-administered medications for this visit.    Allergies:   Review of patient's allergies indicates no known allergies.    Social History:  The patient  reports that he has never smoked. His smokeless tobacco use includes Chew. He reports that he drinks alcohol. He reports that he does not use illicit drugs.   Family History:  The patient's family history includes CAD in his father.    ROS:  Please see the history of present illness.   Otherwise, review of systems are positive for perceived weight gain.   All other systems are reviewed and negative.    PHYSICAL EXAM: VS:  BP 125/80 mmHg  Pulse 64  Ht 5\' 9"  (1.753 m)  Wt 189 lb (85.73 kg)  BMI 27.90 kg/m2 , BMI Body mass index is 27.9 kg/(m^2). GEN: Well nourished, well developed, in no acute distress HEENT: normal Neck: no JVD, carotid bruits, or masses Cardiac: RRR; no murmurs, rubs, or gallops,no edema  Respiratory:  clear to auscultation bilaterally, normal work of breathing GI: soft, nontender, nondistended, +  BS MS: no deformity or atrophy Skin: warm and dry, no rash Neuro:  Strength and sensation are intact Psych: euthymic mood, full affect   EKG:   The ekg ordered today demonstrates normal sinus rhythm, right bundle branch block   Recent Labs: 07/15/2015: ALT 35   Lipid Panel    Component Value Date/Time   CHOL 130 11/26/2014 1506   TRIG 191* 11/26/2014 1506   HDL 37* 11/26/2014 1506   LDLCALC 55 11/26/2014 1506     Other studies Reviewed: Additional studies/ records that were reviewed today with results demonstrating: normal LFTs a few days ago.   ASSESSMENT AND PLAN:  1. HTN : Continue losartan.  Blood pressure well controlled. 2. Hyperlipidemia: results from a few days ago still pending. We'll call him with the results and adjust his lipid lowering  therapy as needed. 3. Tobacco abuse: he would benefit from giving up his smokeless tobacco. He is working with his company for smoking cessation techniques. 4. Right bundle branch block noted on ECG   Current medicines are reviewed at length with the patient today.  The patient concerns regarding his medicines were addressed.  The following changes have been made:  No change  Labs/ tests ordered today include:  No orders of the defined types were placed in this encounter.    Recommend 150 minutes/week of aerobic exercise Low fat, low carb, high fiber diet recommended  Disposition:   FU in 1 year   Teresita Madura., MD  07/18/2015 12:22 PM    [facility] [address], [place], [place]  [address] Phone: [phone]; Fax: [phone]

## 2015-07-15 NOTE — Addendum Note (Signed)
Addended by: Eulis Foster on: 07/15/2015 12:51 PM   Modules accepted: Orders

## 2015-07-17 ENCOUNTER — Other Ambulatory Visit: Payer: Self-pay | Admitting: Interventional Cardiology

## 2015-07-18 ENCOUNTER — Encounter: Payer: Self-pay | Admitting: Interventional Cardiology

## 2015-07-18 ENCOUNTER — Ambulatory Visit (INDEPENDENT_AMBULATORY_CARE_PROVIDER_SITE_OTHER): Payer: 59 | Admitting: Interventional Cardiology

## 2015-07-18 VITALS — BP 125/80 | HR 64 | Ht 69.0 in | Wt 189.0 lb

## 2015-07-18 DIAGNOSIS — E782 Mixed hyperlipidemia: Secondary | ICD-10-CM

## 2015-07-18 DIAGNOSIS — I4510 Unspecified right bundle-branch block: Secondary | ICD-10-CM

## 2015-07-18 DIAGNOSIS — F17200 Nicotine dependence, unspecified, uncomplicated: Secondary | ICD-10-CM

## 2015-07-18 DIAGNOSIS — I10 Essential (primary) hypertension: Secondary | ICD-10-CM

## 2015-07-18 MED ORDER — FENOFIBRATE 160 MG PO TABS: 160.0000 mg | ORAL_TABLET | Freq: Every day | ORAL | Status: DC

## 2015-07-18 NOTE — Patient Instructions (Signed)
**Note De-identified Martin Sims Obfuscation** Medication Instructions:  Same-no changes  Labwork: None  Testing/Procedures: None  Follow-Up: Your physician wants you to follow-up in: 1 year. You will receive a reminder letter in the mail two months in advance. If you don't receive a letter, please call our office to schedule the follow-up appointment.      If you need a refill on your cardiac medications before your next appointment, please call your pharmacy.   

## 2015-07-19 LAB — CARDIO IQ(R) ADVANCED LIPID PANEL
APOLIPOPROTEIN (CARDIO IQ ADV LIPID PANEL): 87 mg/dL (ref 52–109)
Cholesterol, Total: 160 mg/dL (ref 125–200)
Cholesterol/HDL Ratio: 3.6 calc (ref <=5.0)
HDL CHOLESTEROL (CARDIO IQ ADV LIPID PANEL): 44 mg/dL (ref >=40)
LDL Large: 4429 nmol/L (ref 4334–10815)
LDL MEDIUM: 225 nmol/L (ref 167–465)
LDL PEAK SIZE: 209.7 Angstrom — AB (ref >=218.2)
LDL Particle Number: 1348 nmol/L (ref 1016–2185)
LDL Small: 403 nmol/L (ref 123–441)
LDL, Calculated: 67 mg/dL
Lipoprotein (a): 34 nmol/L (ref <75)
NON-HDL CHOLESTEROL (CARDIO IQ ADV LIPID PANEL): 116 mg/dL
TRIGLYCERIDES (CARDIO IQ ADV LIPID PANEL): 246 mg/dL — AB

## 2015-07-22 ENCOUNTER — Telehealth: Payer: Self-pay | Admitting: Interventional Cardiology

## 2015-07-22 ENCOUNTER — Other Ambulatory Visit: Payer: Self-pay | Admitting: *Deleted

## 2015-07-22 DIAGNOSIS — E782 Mixed hyperlipidemia: Secondary | ICD-10-CM

## 2015-07-22 DIAGNOSIS — I10 Essential (primary) hypertension: Secondary | ICD-10-CM

## 2015-07-22 NOTE — Telephone Encounter (Signed)
New message      Returning a call to the nurse to get lab results.  Please call between 2:30 and 3pm if possible.

## 2015-07-22 NOTE — Telephone Encounter (Signed)
Notified of lab results.  Set up an appointment for 01/18/16 for LP and HFP.

## 2015-08-23 ENCOUNTER — Other Ambulatory Visit: Payer: Self-pay | Admitting: *Deleted

## 2015-08-23 MED ORDER — NIACIN ER (ANTIHYPERLIPIDEMIC) 1000 MG PO TBCR: 2000.0000 mg | EXTENDED_RELEASE_TABLET | Freq: Every day | ORAL | Status: DC

## 2015-08-23 MED ORDER — ROSUVASTATIN CALCIUM 10 MG PO TABS: ORAL_TABLET | ORAL | Status: DC

## 2015-08-23 MED ORDER — OMEGA-3-ACID ETHYL ESTERS 1 G PO CAPS: 2.0000 | ORAL_CAPSULE | Freq: Two times a day (BID) | ORAL | Status: DC

## 2016-01-18 ENCOUNTER — Other Ambulatory Visit: Payer: 59

## 2016-01-24 ENCOUNTER — Encounter (INDEPENDENT_AMBULATORY_CARE_PROVIDER_SITE_OTHER): Payer: Self-pay

## 2016-01-24 ENCOUNTER — Other Ambulatory Visit (INDEPENDENT_AMBULATORY_CARE_PROVIDER_SITE_OTHER): Payer: 59

## 2016-01-24 DIAGNOSIS — E782 Mixed hyperlipidemia: Secondary | ICD-10-CM

## 2016-01-24 DIAGNOSIS — I10 Essential (primary) hypertension: Secondary | ICD-10-CM

## 2016-01-24 LAB — HEPATIC FUNCTION PANEL
ALBUMIN: 4.2 g/dL (ref 3.6–5.1)
ALT: 35 U/L (ref 9–46)
AST: 33 U/L (ref 10–35)
Alkaline Phosphatase: 37 U/L — ABNORMAL LOW (ref 40–115)
Bilirubin, Direct: 0.1 mg/dL (ref <=0.2)
Indirect Bilirubin: 0.6 mg/dL (ref 0.2–1.2)
TOTAL PROTEIN: 6.5 g/dL (ref 6.1–8.1)
Total Bilirubin: 0.7 mg/dL (ref 0.2–1.2)

## 2016-01-24 LAB — LIPID PANEL
Cholesterol: 177 mg/dL (ref 125–200)
HDL: 44 mg/dL (ref >=40)
LDL Cholesterol: 72 mg/dL (ref <130)
TRIGLYCERIDES: 307 mg/dL — AB (ref <150)
Total CHOL/HDL Ratio: 4 Ratio (ref <=5.0)
VLDL: 61 mg/dL — AB (ref <30)

## 2016-01-30 ENCOUNTER — Other Ambulatory Visit: Payer: Self-pay

## 2016-01-30 DIAGNOSIS — E782 Mixed hyperlipidemia: Secondary | ICD-10-CM

## 2016-07-25 ENCOUNTER — Other Ambulatory Visit: Payer: 59 | Admitting: *Deleted

## 2016-07-25 ENCOUNTER — Encounter (INDEPENDENT_AMBULATORY_CARE_PROVIDER_SITE_OTHER): Payer: Self-pay

## 2016-07-25 DIAGNOSIS — E782 Mixed hyperlipidemia: Secondary | ICD-10-CM | POA: Diagnosis not present

## 2016-07-25 NOTE — Addendum Note (Signed)
Addended by: Eulis Foster on: 07/25/2016 12:07 PM   Modules accepted: Orders

## 2016-07-26 LAB — LIPID PANEL
CHOL/HDL RATIO: 3.6 (ref 0.0–5.0)
Cholesterol, Total: 153 mg/dL (ref 100–199)
HDL: 43 mg/dL (ref >39)
LDL CALC: 58 (ref 0–99)
TRIGLYCERIDES: 261 mg/dL — AB (ref 0–149)
VLDL Cholesterol Cal: 52 — ABNORMAL HIGH (ref 5–40)

## 2016-07-26 LAB — HEPATIC FUNCTION PANEL
ALT: 34 IU/L (ref 0–44)
AST: 33 IU/L (ref 0–40)
Albumin: 4.4 g/dL (ref 3.5–5.5)
Alkaline Phosphatase: 37 IU/L — ABNORMAL LOW (ref 39–117)
Bilirubin Total: 0.8 mg/dL (ref 0.0–1.2)
Bilirubin, Direct: 0.2 mg/dL (ref 0.00–0.40)
Total Protein: 6.6 g/dL (ref 6.0–8.5)

## 2016-08-03 DIAGNOSIS — R070 Pain in throat: Secondary | ICD-10-CM | POA: Diagnosis not present

## 2016-08-13 ENCOUNTER — Other Ambulatory Visit: Payer: Self-pay | Admitting: Interventional Cardiology

## 2016-09-04 NOTE — Progress Notes (Signed)
Patient ID: Martin Sims, male   DOB: 06-02-1961, 56 y.o.   MRN: [id number]     Cardiology Office Note   Date:  09/05/2016   ID:  Martin Sims, DOB 10/01/1960, MRN [id number]  PCP:  Marjorie Smolder, MD    No chief complaint on file. hyperlipidemia   Wt Readings from Last 3 Encounters:  09/05/16 194 lb (88 kg)  07/18/15 189 lb (85.7 kg)  05/06/14 191 lb (86.6 kg)       History of Present Illness: Martin Sims is a 56 y.o. male  who has had hyperlipidemia and HTN. He has not been exercising regularly since last year due to some personal circumstances. His gym decreased their hours.  His work hours are not convenient at times.  He changed to dayshift but then went back to second shift due to traffic. 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   His most strenuous activity going up stairs. No problems with this.  No longer going to the gym and doing cardio and weights like he did before. He plans to restart exercise He uses CPAP regularly and tolerates it well. Hypertension:  Denies : Chest pain. Dizziness. Leg edema.  Orthopnea.  Paroxysmal nocturnal dyspnea.  Palpitations.  Syncope.   Overall, he feels that he is ok, but too inactive.  Hopes to join [employer] which is open 24 hours per day.    Past Medical History:  Diagnosis Date  . Allergic rhinitis   . Gout   . HTN (hypertension)   . Hypercholesteremia   . Hyperlipidemia   . OSA (obstructive sleep apnea)    PSG 07/24/09 ESS 13, AHI 34/hr REM 29/hr, RDI 34/hr REM 29/hr, O2 min 84%; CPAP 08/17/09 CPAP 7 with AHI 0)    Past Surgical History:  Procedure Laterality Date  . Septoplasty and bilateral turbinate reduction- Dr. Janace Hoard    . SHOULDER SURGERY       Current Outpatient Prescriptions  Medication Sig Dispense Refill  . amLODipine (NORVASC) 5 MG tablet Take 5 mg by mouth daily.     . fenofibrate 160 MG tablet TAKE 1 TABLET (160 MG TOTAL) BY MOUTH DAILY. 90 tablet 2  . losartan  (COZAAR) 50 MG tablet Take 50 mg by mouth daily.     . niacin (NIASPAN) 1000 MG CR tablet Take 2 tablets (2,000 mg total) by mouth at bedtime. 180 tablet 3  . omega-3 acid ethyl esters (LOVAZA) 1 g capsule Take 2 capsules (2 g total) by mouth 2 (two) times daily. 360 capsule 3  . rosuvastatin (CRESTOR) 10 MG tablet Take 1 tablet by mouth twice weekly 30 tablet 3   No current facility-administered medications for this visit.     Allergies:   Patient has no known allergies.    Social History:  The patient  reports that he has never smoked. His smokeless tobacco use includes Chew. He reports that he drinks alcohol. He reports that he does not use drugs.   Family History:  The patient's family history includes CAD in his father. Father was 74 when he had his MI.  Still alive at age 33.   ROS:  Please see the history of present illness.   Otherwise, review of systems are positive for perceived weight gain.   All other systems are reviewed and negative.    PHYSICAL EXAM: VS:  BP 118/72   Pulse 80   Ht 5\' 9"  (1.753 m)   Wt 194  lb (88 kg)   BMI 28.65 kg/m  , BMI Body mass index is 28.65 kg/m. GEN: Well nourished, well developed, in no acute distress  HEENT: normal  Neck: no JVD, carotid bruits, or masses Cardiac: RRR; no murmurs, rubs, or gallops,no edema  Respiratory:  clear to auscultation bilaterally, normal work of breathing GI: soft, nontender, nondistended, + BS MS: no deformity or atrophy  Skin: warm and dry, no rash Neuro:  Strength and sensation are intact Psych: euthymic mood, full affect   EKG:   The ekg ordered today demonstrates normal sinus rhythm, right bundle branch block   Recent Labs: 07/25/2016: ALT 34   Lipid Panel    Component Value Date/Time   CHOL 153 07/25/2016 1207   CHOL 160 07/15/2015 1251   CHOL 130 11/26/2014 1506   TRIG 261 (H) 07/25/2016 1207   TRIG 246 (H) 07/15/2015 1251   TRIG 191 (H) 11/26/2014 1506   HDL 43 07/25/2016 1207   HDL 44  07/15/2015 1251   HDL 37 (L) 11/26/2014 1506   CHOLHDL 3.6 07/25/2016 1207   CHOLHDL 4.0 01/24/2016 1158   VLDL 61 (H) 01/24/2016 1158   LDLCALC 58 07/25/2016 1207   LDLCALC 67 07/15/2015 1251   LDLCALC 55 11/26/2014 1506     Other studies Reviewed: Additional studies/ records that were reviewed today with results demonstrating: normal LFTs a few days ago.   ASSESSMENT AND PLAN:  1. HTN : Continue losartan.  Blood pressure well controlled.  Will need electrolytes checked with Dr. Inda Merlin.   2. Hyperlipidemia: TG still elevated.  HDL and LDL controlled in 2/18.  Avoid sugar intake.  Decrease intake of white bread and starches. 3. Tobacco abuse: he would benefit from giving up his smokeless tobacco. He has cut back  He is working with his company for smoking cessation techniques and this is helping.   4. Right bundle branch block noted on ECG.  No change.   Current medicines are reviewed at length with the patient today.  The patient concerns regarding his medicines were addressed.  The following changes have been made:  No change  Labs/ tests ordered today include:  No orders of the defined types were placed in this encounter.   Recommend 150 minutes/week of aerobic exercise Low fat, low carb, high fiber diet recommended  Disposition:   FU in 1 year   Signed, Larae Grooms, MD  09/05/2016 11:55 AM    [facility] [place], [place], [place]  [address] Phone: [phone]; Fax: [phone]

## 2016-09-05 ENCOUNTER — Encounter: Payer: Self-pay | Admitting: Interventional Cardiology

## 2016-09-05 ENCOUNTER — Ambulatory Visit (INDEPENDENT_AMBULATORY_CARE_PROVIDER_SITE_OTHER): Payer: 59 | Admitting: Interventional Cardiology

## 2016-09-05 VITALS — BP 118/72 | HR 80 | Ht 69.0 in | Wt 194.0 lb

## 2016-09-05 DIAGNOSIS — I4510 Unspecified right bundle-branch block: Secondary | ICD-10-CM | POA: Diagnosis not present

## 2016-09-05 DIAGNOSIS — F17200 Nicotine dependence, unspecified, uncomplicated: Secondary | ICD-10-CM | POA: Diagnosis not present

## 2016-09-05 DIAGNOSIS — I10 Essential (primary) hypertension: Secondary | ICD-10-CM | POA: Diagnosis not present

## 2016-09-05 DIAGNOSIS — E782 Mixed hyperlipidemia: Secondary | ICD-10-CM

## 2016-09-05 NOTE — Patient Instructions (Signed)
Medication Instructions:  Your physician recommends that you continue on your current medications as directed. Please refer to the Current Medication list given to you today.   Labwork: Have CMET and Fasting LIPIDS before your yearly appointment with Dr. Irish Lack.  Testing/Procedures: None.  Follow-Up: Your physician wants you to follow-up in: 1 year with Dr. Irish Lack. You will receive a reminder letter in the mail two months in advance. If you don't receive a letter, please call our office to schedule the follow-up appointment.   Any Other Special Instructions Will Be Listed Below (If Applicable).     If you need a refill on your cardiac medications before your next appointment, please call your pharmacy. [place]

## 2016-11-13 ENCOUNTER — Other Ambulatory Visit: Payer: Self-pay | Admitting: Interventional Cardiology

## 2016-12-23 DIAGNOSIS — M5489 Other dorsalgia: Secondary | ICD-10-CM | POA: Diagnosis not present

## 2017-01-14 DIAGNOSIS — G4733 Obstructive sleep apnea (adult) (pediatric): Secondary | ICD-10-CM | POA: Diagnosis not present

## 2017-03-11 DIAGNOSIS — M109 Gout, unspecified: Secondary | ICD-10-CM | POA: Diagnosis not present

## 2017-03-11 DIAGNOSIS — Z0000 Encounter for general adult medical examination without abnormal findings: Secondary | ICD-10-CM | POA: Diagnosis not present

## 2017-03-11 DIAGNOSIS — R7303 Prediabetes: Secondary | ICD-10-CM | POA: Diagnosis not present

## 2017-03-11 DIAGNOSIS — Z23 Encounter for immunization: Secondary | ICD-10-CM | POA: Diagnosis not present

## 2017-03-11 DIAGNOSIS — I10 Essential (primary) hypertension: Secondary | ICD-10-CM | POA: Diagnosis not present

## 2017-06-21 DIAGNOSIS — M545 Low back pain: Secondary | ICD-10-CM | POA: Diagnosis not present

## 2017-08-05 DIAGNOSIS — D3141 Benign neoplasm of right ciliary body: Secondary | ICD-10-CM | POA: Diagnosis not present

## 2017-08-05 DIAGNOSIS — D3131 Benign neoplasm of right choroid: Secondary | ICD-10-CM | POA: Diagnosis not present

## 2017-08-05 DIAGNOSIS — D3142 Benign neoplasm of left ciliary body: Secondary | ICD-10-CM | POA: Diagnosis not present

## 2017-08-09 ENCOUNTER — Other Ambulatory Visit: Payer: Self-pay | Admitting: *Deleted

## 2017-08-09 MED ORDER — NIACIN ER (ANTIHYPERLIPIDEMIC) 1000 MG PO TBCR: 2000.0000 mg | EXTENDED_RELEASE_TABLET | Freq: Every day | ORAL | 0 refills | Status: DC

## 2017-08-13 ENCOUNTER — Other Ambulatory Visit: Payer: Self-pay | Admitting: Interventional Cardiology

## 2017-09-25 ENCOUNTER — Other Ambulatory Visit: Payer: Self-pay | Admitting: Interventional Cardiology

## 2017-09-30 NOTE — Progress Notes (Signed)
Cardiology Office Note   Date:  10/01/2017   ID:  Martin Sims, DOB [birth year], MRN [id number]  PCP:  Darcus Austin, MD    No chief complaint on file.  Hyperlipidemia, HTN  Wt Readings from Last 3 Encounters:  10/01/17 182 lb 3.2 oz (82.6 kg)  09/05/16 194 lb (88 kg)  07/18/15 189 lb (85.7 kg)       History of Present Illness: Martin Sims is a 58 y.o. male   who has had hyperlipidemia and HTN.   He changed to dayshift but then went back to second shift due to traffic. 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   He is still not exercising much.  He did not join a gym as he was planning.    Denies : Chest pain. Dizziness. Leg edema. Nitroglycerin use. Orthopnea. Palpitations. Paroxysmal nocturnal dyspnea. Shortness of breath. Syncope.   BP is checked at work with readings in the 1200125/70-80.   Past Medical History:  Diagnosis Date  . Allergic rhinitis   . Gout   . HTN (hypertension)   . Hypercholesteremia   . Hyperlipidemia   . OSA (obstructive sleep apnea)    PSG 07/24/09 ESS 13, AHI 34/hr REM 29/hr, RDI 34/hr REM 29/hr, O2 min 84%; CPAP 08/17/09 CPAP 7 with AHI 0)       Current Outpatient Medications  Medication Sig Dispense Refill  . amLODipine (NORVASC) 5 MG tablet Take 5 mg by mouth daily.     . fenofibrate 160 MG tablet TAKE 1 TABLET (160 MG TOTAL) BY MOUTH DAILY. 90 tablet 0  . losartan (COZAAR) 50 MG tablet Take 50 mg by mouth daily.     . niacin (NIASPAN) 1000 MG CR tablet Take 2 tablets (2,000 mg total) by mouth at bedtime. 180 tablet 0  . omega-3 acid ethyl esters (LOVAZA) 1 g capsule Take 2 capsules (2 g total) by mouth 2 (two) times daily. Patient needs to call and schedule an appointment for further refills 1st attempt 120 capsule 0  . rosuvastatin (CRESTOR) 10 MG tablet TAKE 1 TABLET BY MOUTH 2 TIMES A WEEK 30 tablet 2   No current facility-administered medications for this visit.     Allergies:    Patient has no known allergies.    Social History:  The patient  reports that he has never smoked. He quit smokeless tobacco use about 3 weeks ago. His smokeless tobacco use included chew. He reports that he drinks alcohol. He reports that he does not use drugs.   Family History:  The patient's family history includes CAD in his father.    ROS:  Please see the history of present illness.   Otherwise, review of systems are positive for sleep apnea- using CPAP.   All other systems are reviewed and negative.    PHYSICAL EXAM: VS:  BP 120/78   Pulse 64   Ht 5\' 9"  (1.753 m)   Wt 182 lb 3.2 oz (82.6 kg)   SpO2 95%   BMI 26.91 kg/m  , BMI Body mass index is 26.91 kg/m. GEN: Well nourished, well developed, in no acute distress  HEENT: normal  Neck: no JVD, carotid bruits, or masses Cardiac: RRR; no murmurs, rubs, or gallops,no edema  Respiratory:  clear to auscultation bilaterally, normal work of breathing GI: soft, nontender, nondistended, + BS MS: no deformity or atrophy  Skin: warm and dry, no rash Neuro:  Strength and  sensation are intact Psych: euthymic mood, full affect   EKG:   The ekg ordered today demonstrates NSR, RBBB, inferior Q waves; unchanged from 2018   Recent Labs: No results found for requested labs within last 8760 hours.   Lipid Panel    Component Value Date/Time   CHOL 153 07/25/2016 1207   CHOL 160 07/15/2015 1251   CHOL 130 11/26/2014 1506   TRIG 261 (H) 07/25/2016 1207   TRIG 246 (H) 07/15/2015 1251   TRIG 191 (H) 11/26/2014 1506   HDL 43 07/25/2016 1207   HDL 44 07/15/2015 1251   HDL 37 (L) 11/26/2014 1506   CHOLHDL 3.6 07/25/2016 1207   CHOLHDL 4.0 01/24/2016 1158   VLDL 61 (H) 01/24/2016 1158   LDLCALC 58 07/25/2016 1207   LDLCALC 67 07/15/2015 1251   LDLCALC 55 11/26/2014 1506     Other studies Reviewed: Additional studies/ records that were reviewed today with results demonstrating: 2018 labs reviewed.   ASSESSMENT AND  PLAN:  1. HTN: Well controlled.  Continue current meds.  Increase exercise as noted below.  Regular exercise and weight loss can be helpful for maintaining blood pressure and lipid-lowering. 2. Hyperlipidemia:  Check labs today. COntinue lipid lowering therapy 3. Tobacco abuse: He has abstained from chewing tobacco for the past 4 weeks.  He has been using some nicotine lozenges.  He has ocasional cravings still. 4. RBBB: Chronic.  Stable.    Current medicines are reviewed at length with the patient today.  The patient concerns regarding his medicines were addressed.  The following changes have been made:  No change  Labs/ tests ordered today include:  No orders of the defined types were placed in this encounter.   Recommend 150 minutes/week of aerobic exercise Low fat, low carb, high fiber diet recommended  Disposition:   FU in 1 year   Signed, Larae Grooms, MD  10/01/2017 8:17 AM    [facility] [place], [place], [place]  [address] Phone: [phone]; Fax: [phone]

## 2017-10-01 ENCOUNTER — Encounter: Payer: Self-pay | Admitting: Interventional Cardiology

## 2017-10-01 ENCOUNTER — Ambulatory Visit: Payer: 59 | Admitting: Interventional Cardiology

## 2017-10-01 VITALS — BP 120/78 | HR 64 | Ht 69.0 in | Wt 182.2 lb

## 2017-10-01 DIAGNOSIS — E782 Mixed hyperlipidemia: Secondary | ICD-10-CM

## 2017-10-01 DIAGNOSIS — I10 Essential (primary) hypertension: Secondary | ICD-10-CM | POA: Diagnosis not present

## 2017-10-01 DIAGNOSIS — I4510 Unspecified right bundle-branch block: Secondary | ICD-10-CM | POA: Diagnosis not present

## 2017-10-01 DIAGNOSIS — F17200 Nicotine dependence, unspecified, uncomplicated: Secondary | ICD-10-CM

## 2017-10-01 LAB — COMPREHENSIVE METABOLIC PANEL
A/G RATIO: 2.5 — AB (ref 1.2–2.2)
ALBUMIN: 4.5 g/dL (ref 3.5–5.5)
ALT: 32 IU/L (ref 0–44)
AST: 31 IU/L (ref 0–40)
Alkaline Phosphatase: 44 IU/L (ref 39–117)
BILIRUBIN TOTAL: 0.7 mg/dL (ref 0.0–1.2)
BUN / CREAT RATIO: 18 (ref 9–20)
BUN: 16 mg/dL (ref 6–24)
CHLORIDE: 100 mmol/L (ref 96–106)
CO2: 22 mmol/L (ref 20–29)
CREATININE: 0.9 mg/dL (ref 0.76–1.27)
Calcium: 9.5 mg/dL (ref 8.7–10.2)
GFR calc Af Amer: 109 mL/min/{1.73_m2} (ref >59)
GFR calc non Af Amer: 94 mL/min/{1.73_m2} (ref >59)
Globulin, Total: 1.8 g/dL (ref 1.5–4.5)
Glucose: 110 mg/dL — ABNORMAL HIGH (ref 65–99)
POTASSIUM: 3.9 mmol/L (ref 3.5–5.2)
SODIUM: 140 mmol/L (ref 134–144)
Total Protein: 6.3 g/dL (ref 6.0–8.5)

## 2017-10-01 LAB — LIPID PANEL
CHOLESTEROL TOTAL: 145 mg/dL (ref 100–199)
Chol/HDL Ratio: 3.2 ratio (ref 0.0–5.0)
HDL: 45 mg/dL (ref >39)
LDL CALC: 52 mg/dL (ref 0–99)
TRIGLYCERIDES: 240 mg/dL — AB (ref 0–149)
VLDL Cholesterol Cal: 48 mg/dL — ABNORMAL HIGH (ref 5–40)

## 2017-10-01 NOTE — Patient Instructions (Signed)
Medication Instructions:  Your physician recommends that you continue on your current medications as directed. Please refer to the Current Medication list given to you today.   Labwork: TODAY: CMET, LIPIDS  Testing/Procedures: None ordered  Follow-Up: Your physician wants you to follow-up in: 1 year with Dr. Varanasi. You will receive a reminder letter in the mail two months in advance. If you don't receive a letter, please call our office to schedule the follow-up appointment.   Any Other Special Instructions Will Be Listed Below (If Applicable).     If you need a refill on your cardiac medications before your next appointment, please call your pharmacy.   

## 2017-10-03 ENCOUNTER — Other Ambulatory Visit: Payer: Self-pay

## 2017-10-03 DIAGNOSIS — E782 Mixed hyperlipidemia: Secondary | ICD-10-CM

## 2017-10-30 ENCOUNTER — Other Ambulatory Visit: Payer: Self-pay | Admitting: Interventional Cardiology

## 2017-11-07 ENCOUNTER — Other Ambulatory Visit: Payer: Self-pay | Admitting: Interventional Cardiology

## 2017-11-27 ENCOUNTER — Other Ambulatory Visit: Payer: Self-pay | Admitting: Interventional Cardiology

## 2018-02-10 DIAGNOSIS — G4733 Obstructive sleep apnea (adult) (pediatric): Secondary | ICD-10-CM | POA: Diagnosis not present

## 2018-02-12 DIAGNOSIS — G4733 Obstructive sleep apnea (adult) (pediatric): Secondary | ICD-10-CM | POA: Diagnosis not present

## 2018-03-25 DIAGNOSIS — M25562 Pain in left knee: Secondary | ICD-10-CM | POA: Diagnosis not present

## 2018-03-25 DIAGNOSIS — M7652 Patellar tendinitis, left knee: Secondary | ICD-10-CM | POA: Diagnosis not present

## 2018-04-02 DIAGNOSIS — R7303 Prediabetes: Secondary | ICD-10-CM | POA: Diagnosis not present

## 2018-04-02 DIAGNOSIS — Z23 Encounter for immunization: Secondary | ICD-10-CM | POA: Diagnosis not present

## 2018-04-02 DIAGNOSIS — I10 Essential (primary) hypertension: Secondary | ICD-10-CM | POA: Diagnosis not present

## 2018-04-02 DIAGNOSIS — Z125 Encounter for screening for malignant neoplasm of prostate: Secondary | ICD-10-CM | POA: Diagnosis not present

## 2018-04-02 DIAGNOSIS — Z0000 Encounter for general adult medical examination without abnormal findings: Secondary | ICD-10-CM | POA: Diagnosis not present

## 2018-04-02 DIAGNOSIS — E782 Mixed hyperlipidemia: Secondary | ICD-10-CM | POA: Diagnosis not present

## 2018-04-02 DIAGNOSIS — M109 Gout, unspecified: Secondary | ICD-10-CM | POA: Diagnosis not present

## 2018-04-04 DIAGNOSIS — M25562 Pain in left knee: Secondary | ICD-10-CM | POA: Diagnosis not present

## 2018-04-11 DIAGNOSIS — M1712 Unilateral primary osteoarthritis, left knee: Secondary | ICD-10-CM | POA: Diagnosis not present

## 2018-04-11 DIAGNOSIS — M25562 Pain in left knee: Secondary | ICD-10-CM | POA: Diagnosis not present

## 2018-04-11 DIAGNOSIS — S83242D Other tear of medial meniscus, current injury, left knee, subsequent encounter: Secondary | ICD-10-CM | POA: Diagnosis not present

## 2018-08-06 ENCOUNTER — Telehealth: Payer: Self-pay | Admitting: Interventional Cardiology

## 2018-08-06 NOTE — Telephone Encounter (Signed)
New Message:   Pt wants to know if he needs lab work before or the same day of his appt with Dr Irish Lack on 10-02-18?

## 2018-08-08 NOTE — Telephone Encounter (Signed)
Called and scheduled patient fasting lab appointment on same day as OV.

## 2018-08-11 DIAGNOSIS — D3131 Benign neoplasm of right choroid: Secondary | ICD-10-CM | POA: Diagnosis not present

## 2018-08-11 DIAGNOSIS — H2513 Age-related nuclear cataract, bilateral: Secondary | ICD-10-CM | POA: Diagnosis not present

## 2018-09-09 ENCOUNTER — Other Ambulatory Visit: Payer: Self-pay | Admitting: Interventional Cardiology

## 2018-10-02 ENCOUNTER — Other Ambulatory Visit: Payer: 59

## 2018-10-02 ENCOUNTER — Ambulatory Visit: Payer: 59 | Admitting: Interventional Cardiology

## 2018-11-09 ENCOUNTER — Other Ambulatory Visit: Payer: Self-pay | Admitting: Interventional Cardiology

## 2018-11-10 DIAGNOSIS — S83249A Other tear of medial meniscus, current injury, unspecified knee, initial encounter: Secondary | ICD-10-CM | POA: Insufficient documentation

## 2018-11-11 ENCOUNTER — Other Ambulatory Visit: Payer: Self-pay | Admitting: Interventional Cardiology

## 2018-12-16 DIAGNOSIS — M25662 Stiffness of left knee, not elsewhere classified: Secondary | ICD-10-CM | POA: Insufficient documentation

## 2019-01-13 ENCOUNTER — Other Ambulatory Visit: Payer: Self-pay | Admitting: Interventional Cardiology

## 2019-02-04 ENCOUNTER — Other Ambulatory Visit: Payer: Self-pay | Admitting: Interventional Cardiology

## 2019-02-11 ENCOUNTER — Other Ambulatory Visit: Payer: Self-pay | Admitting: Interventional Cardiology

## 2019-02-28 ENCOUNTER — Other Ambulatory Visit: Payer: Self-pay | Admitting: Interventional Cardiology

## 2019-03-03 ENCOUNTER — Telehealth: Payer: Self-pay

## 2019-03-03 NOTE — Telephone Encounter (Signed)
I started an Omega-3 Acid Ethyl Esters PA through covermymeds. Key: [id number]

## 2019-03-04 NOTE — Telephone Encounter (Signed)
**Note De-Identified Weylin Plagge Obfuscation** Letter received from [facility] stating that they have approved the pts Omega-3 Acid Ethyl Esters PA. Approval is good from 03/03/2019 until 03/02/2020  I have notified [facility] of this approval.

## 2019-03-09 ENCOUNTER — Other Ambulatory Visit: Payer: Self-pay | Admitting: Interventional Cardiology

## 2019-03-28 ENCOUNTER — Other Ambulatory Visit: Payer: Self-pay | Admitting: Interventional Cardiology

## 2019-03-31 NOTE — Progress Notes (Signed)
Cardiology Office Note   Date:  04/01/2019   ID:  Martin Sims, DOB 1960-12-13, MRN [id number]  PCP:  London Pepper, MD    No chief complaint on file.  HTN  Wt Readings from Last 3 Encounters:  04/01/19 193 lb 1.9 oz (87.6 kg)  10/01/17 182 lb 3.2 oz (82.6 kg)  09/05/16 194 lb (88 kg)       History of Present Illness: Martin Sims is a 58 y.o. male  who has had hyperlipidemia and HTN.   He changed to dayshift but then went back to second shift due to traffic. 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   Since the last visit, he was out of work for knee surgery in July 2020- arthroscopic surgery.  He is back to work now.   He has been using his CPAP.  Denies : Chest pain. Dizziness. Leg edema. Nitroglycerin use. Orthopnea. Palpitations. Paroxysmal nocturnal dyspnea. Shortness of breath. Syncope.   BP has been controlled.   Past Medical History:  Diagnosis Date  . Allergic rhinitis   . Gout   . HTN (hypertension)   . Hypercholesteremia   . Hyperlipidemia   . OSA (obstructive sleep apnea)    PSG 07/24/09 ESS 13, AHI 34/hr REM 29/hr, RDI 34/hr REM 29/hr, O2 min 84%; CPAP 08/17/09 CPAP 7 with AHI 0)    Past Surgical History:  Procedure Laterality Date  . Septoplasty and bilateral turbinate reduction- Dr. Janace Hoard    . SHOULDER SURGERY       Current Outpatient Medications  Medication Sig Dispense Refill  . amLODipine (NORVASC) 5 MG tablet Take 5 mg by mouth daily.     . fenofibrate 160 MG tablet TAKE 1 TABLET BY MOUTH EVERY DAY NEEDS APPT FOR REFILLS 1ST ATTEMPT 90 tablet 0  . losartan (COZAAR) 50 MG tablet Take 50 mg by mouth daily.     . niacin (NIASPAN) 1000 MG CR tablet Take 1 tablet (1,000 mg total) by mouth at bedtime. Pt needs to keep upcoming appt in Oct for further refills 180 tablet 0  . omega-3 acid ethyl esters (LOVAZA) 1 g capsule TAKE 2 CAPSULES BY MOUTH TWICE A DAY 360 capsule 1  . rosuvastatin (CRESTOR) 10  MG tablet TAKE 1 TABLET BY MOUTH 2 TIMES A WEEK. 24 tablet 0   No current facility-administered medications for this visit.     Allergies:   Patient has no active allergies.    Social History:  The patient  reports that he has never smoked. He quit smokeless tobacco use about 18 months ago.  His smokeless tobacco use included chew. He reports current alcohol use. He reports that he does not use drugs.   Family History:  The patient's family history includes CAD in his father.    ROS:  Please see the history of present illness.   Otherwise, review of systems are positive for weight gain.   All other systems are reviewed and negative.    PHYSICAL EXAM: VS:  BP 122/82   Pulse 72   Ht 5\' 9"  (1.753 m)   Wt 193 lb 1.9 oz (87.6 kg)   SpO2 96%   BMI 28.52 kg/m  , BMI Body mass index is 28.52 kg/m. GEN: Well nourished, well developed, in no acute distress  HEENT: normal  Neck: no JVD, carotid bruits, or masses Cardiac: RRR; no murmurs, rubs, or gallops,no edema  Respiratory:  clear to  auscultation bilaterally, normal work of breathing GI: soft, nontender, nondistended, + BS MS: no deformity or atrophy  Skin: warm and dry, no rash Neuro:  Strength and sensation are intact Psych: euthymic mood, full affect   EKG:   The ekg ordered today demonstrates NSR, inferior Q waves; RBBB not present today compared to 2019 ecg   Recent Labs: No results found for requested labs within last 8760 hours.   Lipid Panel    Component Value Date/Time   CHOL 145 10/01/2017 0827   CHOL 160 07/15/2015 1251   CHOL 130 11/26/2014 1506   TRIG 240 (H) 10/01/2017 0827   TRIG 246 (H) 07/15/2015 1251   TRIG 191 (H) 11/26/2014 1506   HDL 45 10/01/2017 0827   HDL 44 07/15/2015 1251   HDL 37 (L) 11/26/2014 1506   CHOLHDL 3.2 10/01/2017 0827   CHOLHDL 4.0 01/24/2016 1158   VLDL 61 (H) 01/24/2016 1158   LDLCALC 52 10/01/2017 0827   LDLCALC 67 07/15/2015 1251   LDLCALC 55 11/26/2014 1506     Other  studies Reviewed: Additional studies/ records that were reviewed today with results demonstrating: labs reviewed.   ASSESSMENT AND PLAN:  1. HTN: The current medical regimen is effective;  continue present plan and medications. 2. Hyperlipidemia: TG elevated 205. LDL 77.  Increase exercise now that knee is better.  Heart healthy diet recommended.  He eats carbs when that is what his kids are eating.  He is trying to get back to a better eating schedule.  Decrease alcohol.   3. Tobacco abuse: Stopped over a year ago.  Cravings are decreasing. 4. RBBB: Chronic in the past, not noted on today's ECG. 5. Flu shot today.   Current medicines are reviewed at length with the patient today.  The patient concerns regarding his medicines were addressed.  The following changes have been made:  No change  Labs/ tests ordered today include:  No orders of the defined types were placed in this encounter.   Recommend 150 minutes/week of aerobic exercise Low fat, low carb, high fiber diet recommended  Disposition:   FU in 1 year   Signed, Larae Grooms, MD  04/01/2019 10:38 AM    [facility] [place], [place], [place]  [address] Phone: [phone]; Fax: [phone]

## 2019-04-01 ENCOUNTER — Other Ambulatory Visit: Payer: 59 | Admitting: *Deleted

## 2019-04-01 ENCOUNTER — Encounter: Payer: Self-pay | Admitting: Interventional Cardiology

## 2019-04-01 ENCOUNTER — Other Ambulatory Visit: Payer: Self-pay

## 2019-04-01 ENCOUNTER — Ambulatory Visit: Payer: 59 | Admitting: Interventional Cardiology

## 2019-04-01 VITALS — BP 122/82 | HR 72 | Ht 69.0 in | Wt 193.1 lb

## 2019-04-01 DIAGNOSIS — Z23 Encounter for immunization: Secondary | ICD-10-CM | POA: Diagnosis not present

## 2019-04-01 DIAGNOSIS — I4510 Unspecified right bundle-branch block: Secondary | ICD-10-CM | POA: Diagnosis not present

## 2019-04-01 DIAGNOSIS — E782 Mixed hyperlipidemia: Secondary | ICD-10-CM | POA: Diagnosis not present

## 2019-04-01 DIAGNOSIS — I10 Essential (primary) hypertension: Secondary | ICD-10-CM

## 2019-04-01 DIAGNOSIS — F17200 Nicotine dependence, unspecified, uncomplicated: Secondary | ICD-10-CM | POA: Diagnosis not present

## 2019-04-01 LAB — COMPREHENSIVE METABOLIC PANEL
ALT: 48 IU/L — ABNORMAL HIGH (ref 0–44)
AST: 42 IU/L — ABNORMAL HIGH (ref 0–40)
Albumin/Globulin Ratio: 1.8 (ref 1.2–2.2)
Albumin: 4 g/dL (ref 3.8–4.9)
Alkaline Phosphatase: 48 IU/L (ref 39–117)
BUN/Creatinine Ratio: 17 (ref 9–20)
BUN: 14 mg/dL (ref 6–24)
Bilirubin Total: 0.3 mg/dL (ref 0.0–1.2)
CO2: 23 mmol/L (ref 20–29)
Calcium: 9 mg/dL (ref 8.7–10.2)
Chloride: 103 mmol/L (ref 96–106)
Creatinine, Ser: 0.84 mg/dL (ref 0.76–1.27)
GFR calc Af Amer: 111 mL/min/{1.73_m2} (ref >59)
GFR calc non Af Amer: 96 mL/min/{1.73_m2} (ref >59)
Globulin, Total: 2.2 g/dL (ref 1.5–4.5)
Glucose: 108 mg/dL — ABNORMAL HIGH (ref 65–99)
Potassium: 4.1 mmol/L (ref 3.5–5.2)
Sodium: 139 mmol/L (ref 134–144)
Total Protein: 6.2 g/dL (ref 6.0–8.5)

## 2019-04-01 LAB — LIPID PANEL
Chol/HDL Ratio: 3.2 ratio (ref 0.0–5.0)
Cholesterol, Total: 178 mg/dL (ref 100–199)
HDL: 56 mg/dL (ref >39)
LDL Chol Calc (NIH): 90 mg/dL (ref 0–99)
Triglycerides: 188 mg/dL — ABNORMAL HIGH (ref 0–149)
VLDL Cholesterol Cal: 32 mg/dL (ref 5–40)

## 2019-04-01 MED ORDER — LOSARTAN POTASSIUM 50 MG PO TABS: 50.0000 mg | ORAL_TABLET | Freq: Every day | ORAL | 3 refills | Status: AC

## 2019-04-01 MED ORDER — AMLODIPINE BESYLATE 5 MG PO TABS: 5.0000 mg | ORAL_TABLET | Freq: Every day | ORAL | 3 refills | Status: DC

## 2019-04-01 MED ORDER — ROSUVASTATIN CALCIUM 10 MG PO TABS: ORAL_TABLET | ORAL | 3 refills | Status: DC

## 2019-04-01 NOTE — Patient Instructions (Signed)
Medication Instructions:  Your physician recommends that you continue on your current medications as directed. Please refer to the Current Medication list given to you today.  *If you need a refill on your cardiac medications before your next appointment, please call your pharmacy*  Lab Work: None ordered  If you have labs (blood work) drawn today and your tests are completely normal, you will receive your results only by: [name] Kitchen MyChart Message (if you have MyChart) OR . A paper copy in the mail If you have any lab test that is abnormal or we need to change your treatment, we will call you to review the results.  Testing/Procedures: None ordered  Follow-Up: At [facility], you and your health needs are our priority.  As part of our continuing mission to provide you with exceptional heart care, we have created designated Provider Care Teams.  These Care Teams include your primary Cardiologist (physician) and Advanced Practice Providers (APPs -  Physician Assistants and Nurse Practitioners) who all work together to provide you with the care you need, when you need it.  Your next appointment:   12 months  The format for your next appointment:   In Person  Provider:   You may see Casandra Doffing, MD or one of the following Advanced Practice Providers on your designated Care Team:    Melina Copa, PA-C  Ermalinda Barrios, PA-C   Other Instructions  Heart-Healthy Eating Plan Heart-healthy meal planning includes:  Eating less unhealthy fats.  Eating more healthy fats.  Making other changes in your diet. Talk with your doctor or a diet specialist (dietitian) to create an eating plan that is right for you. What is my plan? Your doctor may recommend an eating plan that includes:  Total fat: ______% or less of total calories a day.  Saturated fat: ______% or less of total calories a day.  Cholesterol: less than _________mg a day. What are tips for following this plan? Cooking Avoid  frying your food. Try to bake, boil, grill, or broil it instead. You can also reduce fat by:  Removing the skin from poultry.  Removing all visible fats from meats.  Steaming vegetables in water or broth. Meal planning   At meals, divide your plate into four equal parts: ? Fill one-half of your plate with vegetables and green salads. ? Fill one-fourth of your plate with whole grains. ? Fill one-fourth of your plate with lean protein foods.  Eat 4-5 servings of vegetables per day. A serving of vegetables is: ? 1 cup of raw or cooked vegetables. ? 2 cups of raw leafy greens.  Eat 4-5 servings of fruit per day. A serving of fruit is: ? 1 medium whole fruit. ?  cup of dried fruit. ?  cup of fresh, frozen, or canned fruit. ?  cup of 100% fruit juice.  Eat more foods that have soluble fiber. These are apples, broccoli, carrots, beans, peas, and barley. Try to get 20-30 g of fiber per day.  Eat 4-5 servings of nuts, legumes, and seeds per week: ? 1 serving of dried beans or legumes equals  cup after being cooked. ? 1 serving of nuts is  cup. ? 1 serving of seeds equals 1 tablespoon. General information  Eat more home-cooked food. Eat less restaurant, buffet, and fast food.  Limit or avoid alcohol.  Limit foods that are high in starch and sugar.  Avoid fried foods.  Lose weight if you are overweight.  Keep track of how much salt (  sodium) you eat. This is important if you have high blood pressure. Ask your doctor to tell you more about this.  Try to add vegetarian meals each week. Fats  Choose healthy fats. These include olive oil and canola oil, flaxseeds, walnuts, almonds, and seeds.  Eat more omega-3 fats. These include salmon, mackerel, sardines, tuna, flaxseed oil, and ground flaxseeds. Try to eat fish at least 2 times each week.  Check food labels. Avoid foods with trans fats or high amounts of saturated fat.  Limit saturated fats. ? These are often found in  animal products, such as meats, butter, and cream. ? These are also found in plant foods, such as palm oil, palm kernel oil, and coconut oil.  Avoid foods with partially hydrogenated oils in them. These have trans fats. Examples are stick margarine, some tub margarines, cookies, crackers, and other baked goods. What foods can I eat? Fruits All fresh, canned (in natural juice), or frozen fruits. Vegetables Fresh or frozen vegetables (raw, steamed, roasted, or grilled). Green salads. Grains Most grains. Choose whole wheat and whole grains most of the time. Rice and pasta, including brown rice and pastas made with whole wheat. Meats and other proteins Lean, well-trimmed beef, veal, pork, and lamb. Chicken and [place] without skin. All fish and shellfish. Wild duck, rabbit, pheasant, and venison. Egg whites or low-cholesterol egg substitutes. Dried beans, peas, lentils, and tofu. Seeds and most nuts. Dairy Low-fat or nonfat cheeses, including ricotta and mozzarella. Skim or 1% milk that is liquid, powdered, or evaporated. Buttermilk that is made with low-fat milk. Nonfat or low-fat yogurt. Fats and oils Non-hydrogenated (trans-free) margarines. Vegetable oils, including soybean, sesame, sunflower, olive, peanut, safflower, corn, canola, and cottonseed. Salad dressings or mayonnaise made with a vegetable oil. Beverages Mineral water. Coffee and tea. Diet carbonated beverages. Sweets and desserts Sherbet, gelatin, and fruit ice. Small amounts of dark chocolate. Limit all sweets and desserts. Seasonings and condiments All seasonings and condiments. The items listed above may not be a complete list of foods and drinks you can eat. Contact a dietitian for more options. What foods should I avoid? Fruits Canned fruit in heavy syrup. Fruit in cream or butter sauce. Fried fruit. Limit coconut. Vegetables Vegetables cooked in cheese, cream, or butter sauce. Fried vegetables. Grains Breads that are  made with saturated or trans fats, oils, or whole milk. Croissants. Sweet rolls. Donuts. High-fat crackers, such as cheese crackers. Meats and other proteins Fatty meats, such as hot dogs, ribs, sausage, bacon, rib-eye roast or steak. High-fat deli meats, such as salami and bologna. Caviar. Domestic duck and goose. Organ meats, such as liver. Dairy Cream, sour cream, cream cheese, and creamed cottage cheese. Whole-milk cheeses. Whole or 2% milk that is liquid, evaporated, or condensed. Whole buttermilk. Cream sauce or high-fat cheese sauce. Yogurt that is made from whole milk. Fats and oils Meat fat, or shortening. Cocoa butter, hydrogenated oils, palm oil, coconut oil, palm kernel oil. Solid fats and shortenings, including bacon fat, salt pork, lard, and butter. Nondairy cream substitutes. Salad dressings with cheese or sour cream. Beverages Regular sodas and juice drinks with added sugar. Sweets and desserts Frosting. Pudding. Cookies. Cakes. Pies. Milk chocolate or white chocolate. Buttered syrups. Full-fat ice cream or ice cream drinks. The items listed above may not be a complete list of foods and drinks to avoid. Contact a dietitian for more information. Summary  Heart-healthy meal planning includes eating less unhealthy fats, eating more healthy fats, and making other changes in  your diet.  Eat a balanced diet. This includes fruits and vegetables, low-fat or nonfat dairy, lean protein, nuts and legumes, whole grains, and heart-healthy oils and fats. This information is not intended to replace advice given to you by your health care provider. Make sure you discuss any questions you have with your health care provider. Document Released: 11/20/2011 Document Revised: 07/25/2017 Document Reviewed: 06/28/2017 Elsevier Patient Education  2020 [employer].

## 2019-05-22 ENCOUNTER — Other Ambulatory Visit: Payer: Self-pay | Admitting: Interventional Cardiology

## 2019-05-24 ENCOUNTER — Other Ambulatory Visit: Payer: Self-pay | Admitting: Interventional Cardiology

## 2019-08-13 ENCOUNTER — Ambulatory Visit: Payer: 59 | Attending: Internal Medicine

## 2019-08-13 DIAGNOSIS — Z23 Encounter for immunization: Secondary | ICD-10-CM

## 2019-08-13 NOTE — Progress Notes (Signed)
   Covid-19 Vaccination Clinic  Name:  Martin Sims    MRN: [id number] DOB: [birth year]  08/13/2019  [name] was observed post Covid-19 immunization for 15 minutes without incident. He was provided with Vaccine Information Sheet and instruction to access the V-Safe system.   [name] was instructed to call 911 with any severe reactions post vaccine: [name] Kitchen Difficulty breathing  . Swelling of face and throat  . A fast heartbeat  . A bad rash all over body  . Dizziness and weakness   Immunizations Administered    Name Date Dose VIS Date Route   Moderna COVID-19 Vaccine 08/13/2019 11:02 AM 0.5 mL 05/05/2019 Intramuscular   Manufacturer: Moderna   Lot: [id number]   [id number]

## 2019-08-27 ENCOUNTER — Telehealth: Payer: Self-pay | Admitting: Interventional Cardiology

## 2019-08-27 MED ORDER — NIACIN ER (ANTIHYPERLIPIDEMIC) 1000 MG PO TBCR: 2000.0000 mg | EXTENDED_RELEASE_TABLET | Freq: Every day | ORAL | 3 refills | Status: DC

## 2019-08-27 NOTE — Telephone Encounter (Signed)
Pt's medication niacin states for pt to take 1 tablet daiily. Pt states that he has been taking 2 tablet daily at bedtime. Pt would like a call back concerning this matter. Please clarify how pt is supposed to be taking this medication. Thanks

## 2019-08-27 NOTE — Telephone Encounter (Signed)
OK to refill as he has been taking it, 2000 mg dose.  JV

## 2019-08-27 NOTE — Telephone Encounter (Signed)
  Pt c/o medication issue:  1. Name of Medication: niacin (NIASPAN) 1000 MG CR tablet  2. How are you currently taking this medication (dosage and times per day)? TAKE 1 TABLET BY MOUTH AT BEDTIME. PT NEEDS TO KEEP UPCOMING APPT IN OCT FOR FURTHER REFILLS  3. Are you having a reaction (difficulty breathing--STAT)?   4. What is your medication issue? Pt said he's been taking this medication 2 tablets a day at bed time for years, he needs clarification if he only needs to take 1 a day.  Please call

## 2019-08-27 NOTE — Telephone Encounter (Signed)
I spoke with patient who confirms he has been taking 2 tablets daily. He will continue this dose. Will send prescription to [facility] on [address].

## 2019-08-27 NOTE — Telephone Encounter (Signed)
Chart reviewed and med list at office visit with Dr Irish Lack on 04/01/19 lists niacin dose as 1 tablet (1000 mg) daily. Previous office visit from 10/01/17 lists niacin dose as 2 tablets (2000 mg) daily.  I do not see any place in chart where dose was changed. Per note below patient has been taking 2 tablets daily. Will forward to Dr Irish Lack to see if OK to refill Niacin as patient has been taking

## 2019-09-06 ENCOUNTER — Other Ambulatory Visit: Payer: Self-pay | Admitting: Interventional Cardiology

## 2019-09-15 ENCOUNTER — Ambulatory Visit: Payer: 59 | Attending: Internal Medicine

## 2019-09-15 DIAGNOSIS — Z23 Encounter for immunization: Secondary | ICD-10-CM

## 2019-09-15 NOTE — Progress Notes (Signed)
   Covid-19 Vaccination Clinic  Name:  WELCH KONKLE    MRN: [id number] DOB: 10-21-1960  09/15/2019  Mr. Maltez was observed post Covid-19 immunization for 15 minutes without incident. He was provided with Vaccine Information Sheet and instruction to access the V-Safe system.   Mr. Sartin was instructed to call 911 with any severe reactions post vaccine: [name] Kitchen Difficulty breathing  . Swelling of face and throat  . A fast heartbeat  . A bad rash all over body  . Dizziness and weakness   Immunizations Administered    Name Date Dose VIS Date Route   Moderna COVID-19 Vaccine 09/15/2019 11:51 AM 0.5 mL 05/05/2019 Intramuscular   Manufacturer: Moderna   LotHQ:[id number]   [place]:[id number]

## 2020-01-16 ENCOUNTER — Other Ambulatory Visit: Payer: Self-pay | Admitting: Interventional Cardiology

## 2020-04-20 NOTE — Progress Notes (Signed)
Cardiology Office Note   Date:  04/21/2020   ID:  [name], DOB [birth year], MRN [id number]  PCP:  London Pepper, MD    No chief complaint on file.  HTN  Wt Readings from Last 3 Encounters:  04/21/20 196 lb (88.9 kg)  04/01/19 193 lb 1.9 oz (87.6 kg)  10/01/17 182 lb 3.2 oz (82.6 kg)       History of Present Illness: Martin Sims is a 59 y.o. male  who has had hyperlipidemia and HTN.   He changed to dayshift but then went back to second shift due to traffic. 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   He was out of work for knee surgery in July 2020- arthroscopic surgery.  He is back to work now.   He has been using his CPAP.  Since the last visit, he got his COVID vaccines.  Got his flu shots.    He has had gout in the left knee in 2020.   Denies : Chest pain. Dizziness. Leg edema. Nitroglycerin use. Orthopnea. Palpitations. Paroxysmal nocturnal dyspnea. Shortness of breath. Syncope.   Walks at work.  Knee pain better. Other knee also has some pain.  No access to bike or pool.      Still not going to the gym.    Wife working from home since [place] started, and he feels he has been eating more.      Past Medical History:  Diagnosis Date  . Allergic rhinitis   . Gout   . HTN (hypertension)   . Hypercholesteremia   . Hyperlipidemia   . OSA (obstructive sleep apnea)    PSG 07/24/09 ESS 13, AHI 34/hr REM 29/hr, RDI 34/hr REM 29/hr, O2 min 84%; CPAP 08/17/09 CPAP 7 with AHI 0)    Past Surgical History:  Procedure Laterality Date  . Septoplasty and bilateral turbinate reduction- Dr. Janace Hoard    . SHOULDER SURGERY       Current Outpatient Medications  Medication Sig Dispense Refill  . amLODipine (NORVASC) 5 MG tablet Take 1 tablet (5 mg total) by mouth daily. 90 tablet 3  . fenofibrate 160 MG tablet Take 1 tablet (160 mg total) by mouth daily. 90 tablet 3  . ibuprofen (ADVIL) 400 MG tablet Take 400 mg by mouth  every 6 (six) hours as needed.    [name] Kitchen losartan (COZAAR) 50 MG tablet Take 1 tablet (50 mg total) by mouth daily. 90 tablet 3  . Multiple Vitamin (MULTIVITAMIN) capsule Take 1 capsule by mouth daily.    . niacin (NIASPAN) 1000 MG CR tablet Take 2 tablets (2,000 mg total) by mouth at bedtime. 180 tablet 3  . omega-3 acid ethyl esters (LOVAZA) 1 g capsule TAKE 2 CAPSULES BY MOUTH TWICE A DAY 360 capsule 1  . rosuvastatin (CRESTOR) 10 MG tablet TAKE 1 TABLET BY MOUTH 2 TIMES A WEEK. 24 tablet 3   No current facility-administered medications for this visit.    Allergies:   Patient has no active allergies.    Social History:  The patient  reports that he has never smoked. He quit smokeless tobacco use about 2 years ago.  His smokeless tobacco use included chew. He reports current alcohol use. He reports that he does not use drugs.   Family History:  The patient's family history includes CAD in his father.    ROS:  Please see the history of present illness.   Otherwise, review  of systems are positive for knee pain.   All other systems are reviewed and negative.    PHYSICAL EXAM: VS:  BP 134/82   Pulse 63   Ht 5\' 9"  (1.753 m)   Wt 196 lb (88.9 kg)   SpO2 96%   BMI 28.94 kg/m  , BMI Body mass index is 28.94 kg/m. GEN: Well nourished, well developed, in no acute distress  HEENT: normal  Neck: no JVD, carotid bruits, or masses Cardiac: RRR; no murmurs, rubs, or gallops,no edema  Respiratory:  clear to auscultation bilaterally, normal work of breathing GI: soft, nontender, nondistended, + BS MS: no deformity or atrophy  Skin: warm and dry, no rash Neuro:  Strength and sensation are intact Psych: euthymic mood, full affect   EKG:   The ekg ordered today demonstrates NSR, RBBB   Recent Labs: No results found for requested labs within last 8760 hours.   Lipid Panel    Component Value Date/Time   CHOL 178 04/01/2019 0944   CHOL 160 07/15/2015 1251   CHOL 130 11/26/2014 1506    TRIG 188 (H) 04/01/2019 0944   TRIG 246 (H) 07/15/2015 1251   TRIG 191 (H) 11/26/2014 1506   HDL 56 04/01/2019 0944   HDL 44 07/15/2015 1251   HDL 37 (L) 11/26/2014 1506   CHOLHDL 3.2 04/01/2019 0944   CHOLHDL 4.0 01/24/2016 1158   VLDL 61 (H) 01/24/2016 1158   LDLCALC 90 04/01/2019 0944   LDLCALC 67 07/15/2015 1251   LDLCALC 55 11/26/2014 1506     Other studies Reviewed: Additional studies/ records that were reviewed today with results demonstrating: labs reviewed.     ASSESSMENT AND PLAN:  1. HTN: The current medical regimen is effective;  continue present plan and medications. 2. Hyperlipidemia: The current medical regimen is effective;  continue present plan and medications.  Recheck lipids.   3. Tobacco abuse: He quit chewing tobacco in 4/19.  No relapses.  4. RBBB: Chronic. 5. Elevated fasting blood sugar: Increase exercise.  Whole food plant based diet. A1C to be checked today.    Current medicines are reviewed at length with the patient today.  The patient concerns regarding his medicines were addressed.  The following changes have been made:  No change  Labs/ tests ordered today include:  No orders of the defined types were placed in this encounter.   Recommend 150 minutes/week of aerobic exercise Low fat, low carb, high fiber diet recommended  Disposition:   FU in 1 year   Signed, Larae Grooms, MD  04/21/2020 11:11 AM    [facility] [address], [place], [place]  [address] Phone: [phone]; Fax: [phone]

## 2020-04-21 ENCOUNTER — Encounter: Payer: Self-pay | Admitting: Interventional Cardiology

## 2020-04-21 ENCOUNTER — Ambulatory Visit (INDEPENDENT_AMBULATORY_CARE_PROVIDER_SITE_OTHER): Payer: 59 | Admitting: Interventional Cardiology

## 2020-04-21 ENCOUNTER — Other Ambulatory Visit: Payer: Self-pay

## 2020-04-21 VITALS — BP 134/82 | HR 63 | Ht 69.0 in | Wt 196.0 lb

## 2020-04-21 DIAGNOSIS — I10 Essential (primary) hypertension: Secondary | ICD-10-CM

## 2020-04-21 DIAGNOSIS — E782 Mixed hyperlipidemia: Secondary | ICD-10-CM | POA: Diagnosis not present

## 2020-04-21 DIAGNOSIS — I4510 Unspecified right bundle-branch block: Secondary | ICD-10-CM

## 2020-04-21 DIAGNOSIS — R7301 Impaired fasting glucose: Secondary | ICD-10-CM

## 2020-04-21 DIAGNOSIS — F17200 Nicotine dependence, unspecified, uncomplicated: Secondary | ICD-10-CM

## 2020-04-21 NOTE — Patient Instructions (Signed)
Medication Instructions:  Your physician recommends that you continue on your current medications as directed. Please refer to the Current Medication list given to you today.  *If you need a refill on your cardiac medications before your next appointment, please call your pharmacy*   Lab Work: TODAY: CMET, LIPIDS, A1C  If you have labs (blood work) drawn today and your tests are completely normal, you will receive your results only by: [name] Kitchen MyChart Message (if you have MyChart) OR . A paper copy in the mail If you have any lab test that is abnormal or we need to change your treatment, we will call you to review the results.   Testing/Procedures: None   Follow-Up: At [facility], you and your health needs are our priority.  As part of our continuing mission to provide you with exceptional heart care, we have created designated Provider Care Teams.  These Care Teams include your primary Cardiologist (physician) and Advanced Practice Providers (APPs -  Physician Assistants and Nurse Practitioners) who all work together to provide you with the care you need, when you need it.  We recommend signing up for the patient portal called "MyChart".  Sign up information is provided on this After Visit Summary.  MyChart is used to connect with patients for Virtual Visits (Telemedicine).  Patients are able to view lab/test results, encounter notes, upcoming appointments, etc.  Non-urgent messages can be sent to your provider as well.   To learn more about what you can do with MyChart, go to [email or web address].    Your next appointment:   12 month(s)  The format for your next appointment:   In Person  Provider:   You may see Larae Grooms, MD or one of the following Advanced Practice Providers on your designated Care Team:    Melina Copa, PA-C  Ermalinda Barrios, PA-C    Other Instructions  High-Fiber Diet Fiber, also called dietary fiber, is a type of carbohydrate that is found in  fruits, vegetables, whole grains, and beans. A high-fiber diet can have many health benefits. Your health care provider may recommend a high-fiber diet to help:  Prevent constipation. Fiber can make your bowel movements more regular.  Lower your cholesterol.  Relieve the following conditions: ? Swelling of veins in the anus (hemorrhoids). ? Swelling and irritation (inflammation) of specific areas of the digestive tract (uncomplicated diverticulosis). ? A problem of the large intestine (colon) that sometimes causes pain and diarrhea (irritable bowel syndrome, IBS).  Prevent overeating as part of a weight-loss plan.  Prevent heart disease, type 2 diabetes, and certain cancers. What is my plan? The recommended daily fiber intake in grams (g) includes:  38 g for men age 20 or younger.  30 g for men over age 32.  28 g for women age 72 or younger.  21 g for women over age 9. You can get the recommended daily intake of dietary fiber by:  Eating a variety of fruits, vegetables, grains, and beans.  Taking a fiber supplement, if it is not possible to get enough fiber through your diet. What do I need to know about a high-fiber diet?  It is better to get fiber through food sources rather than from fiber supplements. There is not a lot of research about how effective supplements are.  Always check the fiber content on the nutrition facts label of any prepackaged food. Look for foods that contain 5 g of fiber or more per serving.  Talk with a diet and nutrition  specialist (dietitian) if you have questions about specific foods that are recommended or not recommended for your medical condition, especially if those foods are not listed below.  Gradually increase how much fiber you consume. If you increase your intake of dietary fiber too quickly, you may have bloating, cramping, or gas.  Drink plenty of water. Water helps you to digest fiber. What are tips for following this plan?  Eat a  wide variety of high-fiber foods.  Make sure that half of the grains that you eat each day are whole grains.  Eat breads and cereals that are made with whole-grain flour instead of refined flour or white flour.  Eat brown rice, bulgur wheat, or millet instead of white rice.  Start the day with a breakfast that is high in fiber, such as a cereal that contains 5 g of fiber or more per serving.  Use beans in place of meat in soups, salads, and pasta dishes.  Eat high-fiber snacks, such as berries, raw vegetables, nuts, and popcorn.  Choose whole fruits and vegetables instead of processed forms like juice or sauce. What foods can I eat?  Fruits Berries. Pears. Apples. Oranges. Avocado. Prunes and raisins. Dried figs. Vegetables Sweet potatoes. Spinach. Kale. Artichokes. Cabbage. Broccoli. Cauliflower. Green peas. Carrots. Squash. Grains Whole-grain breads. Multigrain cereal. Oats and oatmeal. Brown rice. Barley. Bulgur wheat. [place]. Quinoa. Bran muffins. Popcorn. Rye wafer crackers. Meats and other proteins Navy, kidney, and pinto beans. Soybeans. Split peas. Lentils. Nuts and seeds. Dairy Fiber-fortified yogurt. Beverages Fiber-fortified soy milk. Fiber-fortified orange juice. Other foods Fiber bars. The items listed above may not be a complete list of recommended foods and beverages. Contact a dietitian for more options. What foods are not recommended? Fruits Fruit juice. Cooked, strained fruit. Vegetables Fried potatoes. Canned vegetables. Well-cooked vegetables. Grains White bread. Pasta made with refined flour. White rice. Meats and other proteins Fatty cuts of meat. Fried chicken or fried fish. Dairy Milk. Yogurt. Cream cheese. Sour cream. Fats and oils Butters. Beverages Soft drinks. Other foods Cakes and pastries. The items listed above may not be a complete list of foods and beverages to avoid. Contact a dietitian for more information. Summary  Fiber is a  type of carbohydrate. It is found in fruits, vegetables, whole grains, and beans.  There are many health benefits of eating a high-fiber diet, such as preventing constipation, lowering blood cholesterol, helping with weight loss, and reducing your risk of heart disease, diabetes, and certain cancers.  Gradually increase your intake of fiber. Increasing too fast can result in cramping, bloating, and gas. Drink plenty of water while you increase your fiber.  The best sources of fiber include whole fruits and vegetables, whole grains, nuts, seeds, and beans. This information is not intended to replace advice given to you by your health care provider. Make sure you discuss any questions you have with your health care provider. Document Revised: 03/25/2017 Document Reviewed: 03/25/2017 Elsevier Patient Education  2020 [employer].

## 2020-04-22 LAB — LIPID PANEL
Chol/HDL Ratio: 2.9 ratio (ref 0.0–5.0)
Cholesterol, Total: 158 mg/dL (ref 100–199)
HDL: 54 mg/dL (ref >39)
LDL Chol Calc (NIH): 85 mg/dL (ref 0–99)
Triglycerides: 106 mg/dL (ref 0–149)
VLDL Cholesterol Cal: 19 mg/dL (ref 5–40)

## 2020-04-22 LAB — HEMOGLOBIN A1C
Est. average glucose Bld gHb Est-mCnc: 120 mg/dL
Hgb A1c MFr Bld: 5.8 % — ABNORMAL HIGH (ref 4.8–5.6)

## 2020-04-22 LAB — COMPREHENSIVE METABOLIC PANEL
ALT: 30 IU/L (ref 0–44)
AST: 29 IU/L (ref 0–40)
Albumin/Globulin Ratio: 1.9 (ref 1.2–2.2)
Albumin: 4.6 g/dL (ref 3.8–4.9)
Alkaline Phosphatase: 47 IU/L (ref 44–121)
BUN/Creatinine Ratio: 14 (ref 10–24)
BUN: 12 mg/dL (ref 8–27)
Bilirubin Total: 0.5 mg/dL (ref 0.0–1.2)
CO2: 24 mmol/L (ref 20–29)
Calcium: 9.4 mg/dL (ref 8.6–10.2)
Chloride: 102 mmol/L (ref 96–106)
Creatinine, Ser: 0.88 mg/dL (ref 0.76–1.27)
GFR calc Af Amer: 108 mL/min/{1.73_m2} (ref >59)
GFR calc non Af Amer: 93 mL/min/{1.73_m2} (ref >59)
Globulin, Total: 2.4 g/dL (ref 1.5–4.5)
Glucose: 109 mg/dL — ABNORMAL HIGH (ref 65–99)
Potassium: 3.8 mmol/L (ref 3.5–5.2)
Sodium: 139 mmol/L (ref 134–144)
Total Protein: 7 g/dL (ref 6.0–8.5)

## 2020-06-13 ENCOUNTER — Other Ambulatory Visit: Payer: Self-pay | Admitting: Interventional Cardiology

## 2020-06-14 ENCOUNTER — Other Ambulatory Visit: Payer: Self-pay | Admitting: Interventional Cardiology

## 2020-07-21 ENCOUNTER — Other Ambulatory Visit: Payer: Self-pay | Admitting: Interventional Cardiology

## 2020-08-17 ENCOUNTER — Other Ambulatory Visit: Payer: Self-pay | Admitting: Interventional Cardiology

## 2020-10-18 ENCOUNTER — Other Ambulatory Visit: Payer: Self-pay | Admitting: Interventional Cardiology

## 2021-03-18 ENCOUNTER — Other Ambulatory Visit: Payer: Self-pay | Admitting: Interventional Cardiology

## 2021-03-26 ENCOUNTER — Other Ambulatory Visit: Payer: Self-pay | Admitting: Interventional Cardiology

## 2021-04-13 ENCOUNTER — Ambulatory Visit: Payer: 59 | Admitting: Interventional Cardiology

## 2021-05-16 NOTE — Progress Notes (Deleted)
Cardiology Office Note   Date:  05/16/2021   ID:  Martin Sims, DOB 1961/01/10, MRN [id number]  PCP:  Martin Pepper, MD    No chief complaint on file.  HTN/hyperlipidemia  Wt Readings from Last 3 Encounters:  04/21/20 196 lb (88.9 kg)  04/01/19 193 lb 1.9 oz (87.6 kg)  10/01/17 182 lb 3.2 oz (82.6 kg)       History of Present Illness: Martin Sims is a 60 y.o. male  who has had hyperlipidemia and HTN.    He changed to dayshift but then went back to second shift due to traffic.  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    He was out of work for knee surgery in July 2020- arthroscopic surgery.  He is back to work now.    He has been using his CPAP.   Since the last visit, he got his COVID vaccines.  Got his flu shots.     He has had gout in the left knee in 2020.     Past Medical History:  Diagnosis Date   Allergic rhinitis    Gout    HTN (hypertension)    Hypercholesteremia    Hyperlipidemia    OSA (obstructive sleep apnea)    PSG 07/24/09 ESS 13, AHI 34/hr REM 29/hr, RDI 34/hr REM 29/hr, O2 min 84%; CPAP 08/17/09 CPAP 7 with AHI 0)    Past Surgical History:  Procedure Laterality Date   Septoplasty and bilateral turbinate reduction- Dr. Janace Hoard     SHOULDER SURGERY       Current Outpatient Medications  Medication Sig Dispense Refill   amLODipine (NORVASC) 5 MG tablet TAKE 1 TABLET BY MOUTH EVERY DAY 90 tablet 3   fenofibrate 160 MG tablet TAKE 1 TABLET BY MOUTH EVERY DAY 90 tablet 3   ibuprofen (ADVIL) 400 MG tablet Take 400 mg by mouth every 6 (six) hours as needed.     losartan (COZAAR) 50 MG tablet Take 1 tablet (50 mg total) by mouth daily. 90 tablet 3   Multiple Vitamin (MULTIVITAMIN) capsule Take 1 capsule by mouth daily.     niacin (NIASPAN) 1000 MG CR tablet TAKE 2 TABLETS BY MOUTH AT BEDTIME 180 tablet 3   omega-3 acid ethyl esters (LOVAZA) 1 g capsule TAKE 2 CAPSULES BY MOUTH TWICE A DAY 360 capsule 2    rosuvastatin (CRESTOR) 10 MG tablet TAKE 1 TABLET BY MOUTH 2 TIMES A WEEK. 25 tablet 3   No current facility-administered medications for this visit.    Allergies:   Patient has no active allergies.    Social History:  The patient  reports that he has never smoked. He quit smokeless tobacco use about 3 years ago.  His smokeless tobacco use included chew. He reports current alcohol use. He reports that he does not use drugs.   Family History:  The patient's ***family history includes CAD in his father.    ROS:  Please see the history of present illness.   Otherwise, review of systems are positive for ***.   All other systems are reviewed and negative.    PHYSICAL EXAM: VS:  There were no vitals taken for this visit. , BMI There is no height or weight on file to calculate BMI. GEN: Well nourished, well developed, in no acute distress HEENT: normal Neck: no JVD, carotid bruits, or masses Cardiac: ***RRR; no murmurs, rubs, or gallops,no edema  Respiratory:  clear to auscultation bilaterally, normal work of breathing GI: soft, nontender, nondistended, + BS MS: no deformity or atrophy Skin: warm and dry, no rash Neuro:  Strength and sensation are intact Psych: euthymic mood, full affect   EKG:   The ekg ordered today demonstrates ***   Recent Labs: No results found for requested labs within last 8760 hours.   Lipid Panel    Component Value Date/Time   CHOL 158 04/21/2020 1127   CHOL 160 07/15/2015 1251   CHOL 130 11/26/2014 1506   TRIG 106 04/21/2020 1127   TRIG 246 (H) 07/15/2015 1251   TRIG 191 (H) 11/26/2014 1506   HDL 54 04/21/2020 1127   HDL 44 07/15/2015 1251   HDL 37 (L) 11/26/2014 1506   CHOLHDL 2.9 04/21/2020 1127   CHOLHDL 4.0 01/24/2016 1158   VLDL 61 (H) 01/24/2016 1158   LDLCALC 85 04/21/2020 1127   LDLCALC 67 07/15/2015 1251   LDLCALC 55 11/26/2014 1506     Other studies Reviewed: Additional studies/ records that were reviewed today with results  demonstrating: ***.   ASSESSMENT AND PLAN:  HTN:  Hyperlipidemia: Tobacco abuse: RBBB: Elevated fasting blood sugar:   Current medicines are reviewed at length with the patient today.  The patient concerns regarding his medicines were addressed.  The following changes have been made:  No change***  Labs/ tests ordered today include: *** No orders of the defined types were placed in this encounter.   Recommend 150 minutes/week of aerobic exercise Low fat, low carb, high fiber diet recommended  Disposition:   FU in ***   Signed, Larae Grooms, MD  05/16/2021 5:21 PM    [facility] [address], [place], [place]  [address] Phone: [phone]; Fax: [phone]

## 2021-05-18 ENCOUNTER — Ambulatory Visit: Payer: 59 | Admitting: Interventional Cardiology

## 2021-05-18 DIAGNOSIS — I10 Essential (primary) hypertension: Secondary | ICD-10-CM

## 2021-05-18 DIAGNOSIS — E782 Mixed hyperlipidemia: Secondary | ICD-10-CM

## 2021-05-18 DIAGNOSIS — R7301 Impaired fasting glucose: Secondary | ICD-10-CM

## 2021-05-18 DIAGNOSIS — I4510 Unspecified right bundle-branch block: Secondary | ICD-10-CM

## 2021-05-18 DIAGNOSIS — Z720 Tobacco use: Secondary | ICD-10-CM

## 2021-07-07 ENCOUNTER — Other Ambulatory Visit: Payer: Self-pay | Admitting: Interventional Cardiology

## 2021-07-30 NOTE — Progress Notes (Signed)
Cardiology Office Note   Date:  08/01/2021   ID:  Martin Sims, DOB Apr 14, 1961, MRN [id number]  PCP:  London Pepper, MD    Chief Complaint  Patient presents with   Follow-up   HTN  Wt Readings from Last 3 Encounters:  08/01/21 189 lb (85.7 kg)  04/21/20 196 lb (88.9 kg)  04/01/19 193 lb 1.9 oz (87.6 kg)       History of Present Illness: Martin Sims is a 61 y.o. male   who has had hyperlipidemia and HTN.    He changed to dayshift but then went back to second shift due to traffic.  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    He was out of work for knee surgery in July 2020- arthroscopic surgery.  He is back to work now.    He has been using his CPAP.   He got his COVID vaccines.  Got his flu shots.     He has had gout in the left knee in 2020.  Denies : Chest pain. Dizziness. Leg edema. Nitroglycerin use. Orthopnea. Palpitations. Paroxysmal nocturnal dyspnea. Shortness of breath. Syncope.    He has had a lot of stress in the past few months.     Past Medical History:  Diagnosis Date   Allergic rhinitis    Gout    HTN (hypertension)    Hypercholesteremia    Hyperlipidemia    OSA (obstructive sleep apnea)    PSG 07/24/09 ESS 13, AHI 34/hr REM 29/hr, RDI 34/hr REM 29/hr, O2 min 84%; CPAP 08/17/09 CPAP 7 with AHI 0)    Past Surgical History:  Procedure Laterality Date   Septoplasty and bilateral turbinate reduction- Dr. Janace Hoard     SHOULDER SURGERY       Current Outpatient Medications  Medication Sig Dispense Refill   amLODipine (NORVASC) 5 MG tablet TAKE 1 TABLET BY MOUTH EVERY DAY 90 tablet 3   fenofibrate 160 MG tablet TAKE 1 TABLET BY MOUTH EVERY DAY 90 tablet 3   ibuprofen (ADVIL) 400 MG tablet Take 400 mg by mouth every 6 (six) hours as needed.     losartan (COZAAR) 50 MG tablet Take 1 tablet (50 mg total) by mouth daily. 90 tablet 3   Multiple Vitamin (MULTIVITAMIN) capsule Take 1 capsule by mouth daily.      niacin (NIASPAN) 1000 MG CR tablet Take 1,000 mg by mouth at bedtime.     omega-3 acid ethyl esters (LOVAZA) 1 g capsule TAKE 2 CAPSULES BY MOUTH TWICE A DAY 360 capsule 2   rosuvastatin (CRESTOR) 10 MG tablet TAKE 1 TABLET BY MOUTH 2 TIMES A WEEK 25 tablet 0   No current facility-administered medications for this visit.    Allergies:   Patient has no known allergies.    Social History:  The patient  reports that he has never smoked. He quit smokeless tobacco use about 3 years ago.  His smokeless tobacco use included chew. He reports current alcohol use. He reports that he does not use drugs.   Family History:  The patient's family history includes CAD in his father.    ROS:  Please see the history of present illness.   Otherwise, review of systems are positive for stress.   All other systems are reviewed and negative.    PHYSICAL EXAM: VS:  BP 132/88    Pulse 92    Ht 5\' 9"  (1.753 m)  Wt 189 lb (85.7 kg)    SpO2 94%    BMI 27.91 kg/m  , BMI Body mass index is 27.91 kg/m. GEN: Well nourished, well developed, in no acute distress HEENT: normal Neck: no JVD, carotid bruits, or masses Cardiac: RRR; no murmurs, rubs, or gallops,no edema  Respiratory:  clear to auscultation bilaterally, normal work of breathing GI: soft, nontender, nondistended, + BS MS: no deformity or atrophy Skin: warm and dry, no rash Neuro:  Strength and sensation are intact Psych: euthymic mood, full affect   EKG:   The ekg ordered today demonstrates NSR, RBBB   Recent Labs: No results found for requested labs within last 8760 hours.   Lipid Panel    Component Value Date/Time   CHOL 158 04/21/2020 1127   CHOL 160 07/15/2015 1251   CHOL 130 11/26/2014 1506   TRIG 106 04/21/2020 1127   TRIG 246 (H) 07/15/2015 1251   TRIG 191 (H) 11/26/2014 1506   HDL 54 04/21/2020 1127   HDL 44 07/15/2015 1251   HDL 37 (L) 11/26/2014 1506   CHOLHDL 2.9 04/21/2020 1127   CHOLHDL 4.0 01/24/2016 1158   VLDL 61  (H) 01/24/2016 1158   LDLCALC 85 04/21/2020 1127   LDLCALC 67 07/15/2015 1251   LDLCALC 55 11/26/2014 1506     Other studies Reviewed: Additional studies/ records that were reviewed today with results demonstrating: no change from prior ECG; 2021 labs reviewed.   ASSESSMENT AND PLAN:  HTN: The current medical regimen is effective;  continue present plan and medications. Hyperlipidemia: Check fasting lipids today.  Tobacco abuse: Quit chewing tobacco in 4/19. RBBB: Chronic Elevated blood sugar: Check labs today.  Was not fasting at his PMD.    Current medicines are reviewed at length with the patient today.  The patient concerns regarding his medicines were addressed.  The following changes have been made:  No change  Labs/ tests ordered today include: fasting labs today No orders of the defined types were placed in this encounter.   Recommend 150 minutes/week of aerobic exercise Low fat, low carb, high fiber diet recommended  Disposition:   FU in 1 year   Signed, Larae Grooms, MD  08/01/2021 11:10 AM    [facility], [place], [place]  [address] Phone: [phone]; Fax: [phone]

## 2021-08-01 ENCOUNTER — Encounter: Payer: Self-pay | Admitting: Interventional Cardiology

## 2021-08-01 ENCOUNTER — Other Ambulatory Visit: Payer: Self-pay

## 2021-08-01 ENCOUNTER — Ambulatory Visit (INDEPENDENT_AMBULATORY_CARE_PROVIDER_SITE_OTHER): Payer: 59 | Admitting: Interventional Cardiology

## 2021-08-01 VITALS — BP 132/88 | HR 92 | Ht 69.0 in | Wt 189.0 lb

## 2021-08-01 DIAGNOSIS — R7301 Impaired fasting glucose: Secondary | ICD-10-CM | POA: Diagnosis not present

## 2021-08-01 DIAGNOSIS — I4510 Unspecified right bundle-branch block: Secondary | ICD-10-CM | POA: Diagnosis not present

## 2021-08-01 DIAGNOSIS — E782 Mixed hyperlipidemia: Secondary | ICD-10-CM | POA: Diagnosis not present

## 2021-08-01 DIAGNOSIS — I10 Essential (primary) hypertension: Secondary | ICD-10-CM

## 2021-08-01 LAB — COMPREHENSIVE METABOLIC PANEL
ALT: 42 IU/L (ref 0–44)
AST: 48 IU/L — ABNORMAL HIGH (ref 0–40)
Albumin/Globulin Ratio: 2.1 (ref 1.2–2.2)
Albumin: 4.6 g/dL (ref 3.8–4.8)
Alkaline Phosphatase: 45 IU/L (ref 44–121)
BUN/Creatinine Ratio: 13 (ref 10–24)
BUN: 12 mg/dL (ref 8–27)
Bilirubin Total: 0.9 mg/dL (ref 0.0–1.2)
CO2: 22 mmol/L (ref 20–29)
Calcium: 9.3 mg/dL (ref 8.6–10.2)
Chloride: 103 mmol/L (ref 96–106)
Creatinine, Ser: 0.95 mg/dL (ref 0.76–1.27)
Globulin, Total: 2.2 g/dL (ref 1.5–4.5)
Glucose: 109 mg/dL — ABNORMAL HIGH (ref 70–99)
Potassium: 4 mmol/L (ref 3.5–5.2)
Sodium: 140 mmol/L (ref 134–144)
Total Protein: 6.8 g/dL (ref 6.0–8.5)
eGFR: 91 mL/min/{1.73_m2} (ref >59)

## 2021-08-01 LAB — HEMOGLOBIN A1C
Est. average glucose Bld gHb Est-mCnc: 120 mg/dL
Hgb A1c MFr Bld: 5.8 % — ABNORMAL HIGH (ref 4.8–5.6)

## 2021-08-01 LAB — LIPID PANEL
Chol/HDL Ratio: 2.7 ratio (ref 0.0–5.0)
Cholesterol, Total: 150 mg/dL (ref 100–199)
HDL: 56 mg/dL (ref >39)
LDL Chol Calc (NIH): 72 mg/dL (ref 0–99)
Triglycerides: 128 mg/dL (ref 0–149)
VLDL Cholesterol Cal: 22 mg/dL (ref 5–40)

## 2021-08-01 NOTE — Patient Instructions (Signed)
Medication Instructions:  Your physician recommends that you continue on your current medications as directed. Please refer to the Current Medication list given to you today.  *If you need a refill on your cardiac medications before your next appointment, please call your pharmacy*   Lab Work: Lab work to be done today--CMET, Lipids and A1C If you have labs (blood work) drawn today and your tests are completely normal, you will receive your results only by: [place] (if you have MyChart) OR A paper copy in the mail If you have any lab test that is abnormal or we need to change your treatment, we will call you to review the results.   Testing/Procedures: none   Follow-Up: At [facility], you and your health needs are our priority.  As part of our continuing mission to provide you with exceptional heart care, we have created designated Provider Care Teams.  These Care Teams include your primary Cardiologist (physician) and Advanced Practice Providers (APPs -  Physician Assistants and Nurse Practitioners) who all work together to provide you with the care you need, when you need it.  We recommend signing up for the patient portal called "MyChart".  Sign up information is provided on this After Visit Summary.  MyChart is used to connect with patients for Virtual Visits (Telemedicine).  Patients are able to view lab/test results, encounter notes, upcoming appointments, etc.  Non-urgent messages can be sent to your provider as well.   To learn more about what you can do with MyChart, go to [email or web address].    Your next appointment:   12 month(s)  The format for your next appointment:   In Person  Provider:   Larae Grooms, MD     Other Instructions

## 2021-09-29 ENCOUNTER — Other Ambulatory Visit: Payer: Self-pay | Admitting: Interventional Cardiology

## 2021-10-09 ENCOUNTER — Other Ambulatory Visit: Payer: Self-pay | Admitting: Interventional Cardiology

## 2021-11-07 ENCOUNTER — Encounter: Payer: Self-pay | Admitting: Podiatry

## 2021-11-07 ENCOUNTER — Ambulatory Visit: Payer: 59 | Admitting: Podiatry

## 2021-11-07 DIAGNOSIS — L600 Ingrowing nail: Secondary | ICD-10-CM | POA: Diagnosis not present

## 2021-11-07 DIAGNOSIS — J301 Allergic rhinitis due to pollen: Secondary | ICD-10-CM | POA: Insufficient documentation

## 2021-11-07 DIAGNOSIS — G5710 Meralgia paresthetica, unspecified lower limb: Secondary | ICD-10-CM | POA: Insufficient documentation

## 2021-11-07 DIAGNOSIS — G4733 Obstructive sleep apnea (adult) (pediatric): Secondary | ICD-10-CM | POA: Insufficient documentation

## 2021-11-07 DIAGNOSIS — J309 Allergic rhinitis, unspecified: Secondary | ICD-10-CM | POA: Insufficient documentation

## 2021-11-07 DIAGNOSIS — M109 Gout, unspecified: Secondary | ICD-10-CM | POA: Insufficient documentation

## 2021-11-07 DIAGNOSIS — R7303 Prediabetes: Secondary | ICD-10-CM | POA: Insufficient documentation

## 2021-11-07 DIAGNOSIS — G4730 Sleep apnea, unspecified: Secondary | ICD-10-CM | POA: Insufficient documentation

## 2021-11-07 MED ORDER — NEOMYCIN-POLYMYXIN-HC 1 % OT SOLN: OTIC | 0 refills | Status: DC

## 2021-11-07 NOTE — Patient Instructions (Signed)

## 2021-11-13 ENCOUNTER — Encounter: Payer: Self-pay | Admitting: Podiatry

## 2021-11-13 NOTE — Progress Notes (Signed)
  Subjective:  Patient ID: Martin Sims, male    DOB: Oct 29, 1960,  MRN: [id number]  Chief Complaint  Patient presents with   Ingrown Toenail    np right great toe possible ingrown    61 y.o. male presents with the above complaint. History confirmed with patient.  Nail has been very painful  Objective:  Physical Exam: warm, good capillary refill, no trophic changes or ulcerative lesions, normal DP and PT pulses, normal sensory exam, and ingrown nail medial border.  Assessment:   1. Ingrowing right great toenail      Plan:  Patient was evaluated and treated and all questions answered.    Ingrown Nail, right -Patient elects to proceed with minor surgery to remove ingrown toenail today. Consent reviewed and signed by patient. -Ingrown nail excised. See procedure note. -Educated on post-procedure care including soaking. Written instructions provided and reviewed.  Procedure: Excision of Ingrown Toenail Location: Right 1st toe medial nail borders. Anesthesia: Lidocaine 1% plain; 1.5 mL and Marcaine 0.5% plain; 1.5 mL, digital block. Skin Prep: Betadine. Dressing: Silvadene; telfa; dry, sterile, compression dressing. Technique: Following skin prep, the toe was exsanguinated and a tourniquet was secured at the base of the toe. The affected nail border was freed, split with a nail splitter, and excised. Chemical matrixectomy was then performed with phenol and irrigated out with alcohol. The tourniquet was then removed and sterile dressing applied. Disposition: Patient tolerated procedure well. Patient to return in 2 weeks for follow-up.    Return if symptoms worsen or fail to improve.

## 2022-02-19 ENCOUNTER — Other Ambulatory Visit: Payer: Self-pay | Admitting: Interventional Cardiology

## 2022-03-16 ENCOUNTER — Other Ambulatory Visit: Payer: Self-pay | Admitting: Interventional Cardiology

## 2022-04-15 ENCOUNTER — Other Ambulatory Visit: Payer: Self-pay | Admitting: Interventional Cardiology

## 2022-05-03 ENCOUNTER — Other Ambulatory Visit: Payer: Self-pay | Admitting: Interventional Cardiology

## 2022-08-02 ENCOUNTER — Encounter: Payer: Self-pay | Admitting: Interventional Cardiology

## 2022-08-02 ENCOUNTER — Ambulatory Visit: Payer: 59 | Attending: Interventional Cardiology | Admitting: Interventional Cardiology

## 2022-08-02 VITALS — BP 132/76 | HR 64 | Ht 68.5 in | Wt 172.6 lb

## 2022-08-02 DIAGNOSIS — R7301 Impaired fasting glucose: Secondary | ICD-10-CM

## 2022-08-02 DIAGNOSIS — I10 Essential (primary) hypertension: Secondary | ICD-10-CM | POA: Diagnosis not present

## 2022-08-02 DIAGNOSIS — E782 Mixed hyperlipidemia: Secondary | ICD-10-CM | POA: Diagnosis not present

## 2022-08-02 DIAGNOSIS — I4510 Unspecified right bundle-branch block: Secondary | ICD-10-CM

## 2022-08-02 LAB — LIPID PANEL

## 2022-08-02 NOTE — Patient Instructions (Signed)
Medication Instructions:  Your physician recommends that you continue on your current medications as directed. Please refer to the Current Medication list given to you today.  *If you need a refill on your cardiac medications before your next appointment, please call your pharmacy*   Lab Work: Lab work to be done today--CBC, CMET,Lipids, A1C If you have labs (blood work) drawn today and your tests are completely normal, you will receive your results only by: [place] (if you have MyChart) OR A paper copy in the mail If you have any lab test that is abnormal or we need to change your treatment, we will call you to review the results.   Testing/Procedures: none   Follow-Up: At [facility], you and your health needs are our priority.  As part of our continuing mission to provide you with exceptional heart care, we have created designated Provider Care Teams.  These Care Teams include your primary Cardiologist (physician) and Advanced Practice Providers (APPs -  Physician Assistants and Nurse Practitioners) who all work together to provide you with the care you need, when you need it.  We recommend signing up for the patient portal called "MyChart".  Sign up information is provided on this After Visit Summary.  MyChart is used to connect with patients for Virtual Visits (Telemedicine).  Patients are able to view lab/test results, encounter notes, upcoming appointments, etc.  Non-urgent messages can be sent to your provider as well.   To learn more about what you can do with MyChart, go to [email or web address].    Your next appointment:   12 month(s)  Provider:   Larae Grooms, MD     Other Instructions

## 2022-08-02 NOTE — Progress Notes (Signed)
Cardiology Office Note   Date:  08/02/2022   ID:  Martin Sims, DOB 1961-03-30, MRN [id number]  PCP:  London Pepper, MD    No chief complaint on file.  HTN  Wt Readings from Last 3 Encounters:  08/02/22 172 lb 9.6 oz (78.3 kg)  08/01/21 189 lb (85.7 kg)  04/21/20 196 lb (88.9 kg)       History of Present Illness: Martin Sims is a 62 y.o. male    who has had hyperlipidemia and HTN.    He changed to dayshift but then went back to second shift due to traffic.  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    He was out of work for knee surgery in July 2020- arthroscopic surgery.  He is back to work now.    He has been using his CPAP.   He got his COVID vaccines.  Got his flu shots.     He has had gout in the left knee in 31-Aug-2018. Father passed away in 15-Mar-2023 at age 68.  They spent a lot of time together.   Retiring in April 2024. Has two grandkids that live with them.   Occasional walking.    Denies : Chest pain. Dizziness. Leg edema. Nitroglycerin use. Orthopnea. Palpitations. Paroxysmal nocturnal dyspnea. Shortness of breath. Syncope.    Weight loss with loss of appetite.  Digestive issues improved after starting back on FAJE yogurt.  Past Medical History:  Diagnosis Date   Allergic rhinitis    Gout    HTN (hypertension)    Hypercholesteremia    Hyperlipidemia    OSA (obstructive sleep apnea)    PSG 07/24/09 ESS 13, AHI 34/hr REM 29/hr, RDI 34/hr REM 29/hr, O2 min 84%; CPAP 08/17/09 CPAP 7 with AHI 0)    Past Surgical History:  Procedure Laterality Date   Septoplasty and bilateral turbinate reduction- Dr. Janace Hoard     SHOULDER SURGERY       Current Outpatient Medications  Medication Sig Dispense Refill   amLODipine (NORVASC) 5 MG tablet TAKE 1 TABLET BY MOUTH EVERY DAY 90 tablet 1   fenofibrate 160 MG tablet TAKE 1 TABLET BY MOUTH EVERY DAY 90 tablet 0   ibuprofen (ADVIL) 400 MG tablet Take 400 mg by mouth every 6 (six)  hours as needed.     losartan (COZAAR) 50 MG tablet Take 1 tablet (50 mg total) by mouth daily. 90 tablet 3   Multiple Vitamin (MULTIVITAMIN) capsule Take 1 capsule by mouth daily.     omega-3 acid ethyl esters (LOVAZA) 1 g capsule TAKE 2 CAPSULES BY MOUTH TWICE A DAY 360 capsule 1   rosuvastatin (CRESTOR) 10 MG tablet TAKE 1 TABLET BY MOUTH 2 TIMES A WEEK 25 tablet 3   NEOMYCIN-POLYMYXIN-HYDROCORTISONE (CORTISPORIN) 1 % SOLN OTIC solution Apply to nail beds from procedure site twice daily after soaks (Patient not taking: Reported on 08/02/2022) 10 mL 0   niacin (NIASPAN) 1000 MG CR tablet TAKE 1 TABLET (1,000 MG TOTAL) BY MOUTH AT BEDTIME. (Patient not taking: Reported on 08/02/2022) 90 tablet 1   No current facility-administered medications for this visit.    Allergies:   Naproxen and Silicone    Social History:  The patient  reports that he has never smoked. He quit smokeless tobacco use about 4 years ago.  His smokeless tobacco use included chew. He reports current alcohol use. He reports that he does not use drugs.  Family History:  The patient's family history includes CAD in his father.    ROS:  Please see the history of present illness.   Otherwise, review of systems are positive for weight loss,partly from grief.   All other systems are reviewed and negative.    PHYSICAL EXAM: VS:  BP 132/76   Pulse 64   Ht 5' 8.5" (1.74 m)   Wt 172 lb 9.6 oz (78.3 kg)   SpO2 98%   BMI 25.86 kg/m  , BMI Body mass index is 25.86 kg/m. GEN: Well nourished, well developed, in no acute distress HEENT: normal Neck: no JVD, carotid bruits, or masses Cardiac: RRR; no murmurs, rubs, or gallops,no edema  Respiratory:  clear to auscultation bilaterally, normal work of breathing GI: soft, nontender, nondistended, + BS MS: no deformity or atrophy Skin: warm and dry, no rash Neuro:  Strength and sensation are intact Psych: euthymic mood, full affect   EKG:   The ekg ordered today demonstrates  NSR, RBBB   Recent Labs: No results found for requested labs within last 365 days.   Lipid Panel    Component Value Date/Time   CHOL 150 08/01/2021 1130   CHOL 160 07/15/2015 1251   CHOL 130 11/26/2014 1506   TRIG 128 08/01/2021 1130   TRIG 246 (H) 07/15/2015 1251   TRIG 191 (H) 11/26/2014 1506   HDL 56 08/01/2021 1130   HDL 44 07/15/2015 1251   HDL 37 (L) 11/26/2014 1506   CHOLHDL 2.7 08/01/2021 1130   CHOLHDL 4.0 01/24/2016 1158   VLDL 61 (H) 01/24/2016 1158   LDLCALC 72 08/01/2021 1130   LDLCALC 67 07/15/2015 1251   LDLCALC 55 11/26/2014 1506     Other studies Reviewed: Additional studies/ records that were reviewed today with results demonstrating: 2023 labs reviewed.   ASSESSMENT AND PLAN:  HTN: The current medical regimen is effective;  continue present plan and medications.  Well controlled.  Hyperlipidemia: Check lipids today.  Whole food plant based diet.  High fiber diet.  Avoid processed foods. Former Tobacco use: Used chewing tobacco until 2019. RBBB: chronic Elevated blood sugar: A1C 5.8.  Reducing alcohol.     Current medicines are reviewed at length with the patient today.  The patient concerns regarding his medicines were addressed.  The following changes have been made:  No change  Labs/ tests ordered today include:  No orders of the defined types were placed in this encounter.   Recommend 150 minutes/week of aerobic exercise Low fat, low carb, high fiber diet recommended  Disposition:   FU in 1 year   Signed, Larae Grooms, MD  08/02/2022 9:14 AM    [facility], [place], [place]  [address] Phone: [phone]; Fax: [phone]

## 2022-08-03 LAB — COMPREHENSIVE METABOLIC PANEL
ALT: 36 IU/L (ref 0–44)
AST: 39 IU/L (ref 0–40)
Albumin/Globulin Ratio: 2 (ref 1.2–2.2)
Albumin: 4.5 g/dL (ref 3.9–4.9)
Alkaline Phosphatase: 50 IU/L (ref 44–121)
BUN/Creatinine Ratio: 20 (ref 10–24)
BUN: 18 mg/dL (ref 8–27)
Bilirubin Total: 0.5 mg/dL (ref 0.0–1.2)
CO2: 22 mmol/L (ref 20–29)
Calcium: 9 mg/dL (ref 8.6–10.2)
Chloride: 105 mmol/L (ref 96–106)
Creatinine, Ser: 0.89 mg/dL (ref 0.76–1.27)
Globulin, Total: 2.3 g/dL (ref 1.5–4.5)
Glucose: 90 mg/dL (ref 70–99)
Potassium: 4 mmol/L (ref 3.5–5.2)
Sodium: 143 mmol/L (ref 134–144)
Total Protein: 6.8 g/dL (ref 6.0–8.5)
eGFR: 97 mL/min/{1.73_m2} (ref >59)

## 2022-08-03 LAB — CBC
Hematocrit: 43.3 % (ref 37.5–51.0)
Hemoglobin: 14.4 g/dL (ref 13.0–17.7)
MCH: 33.7 pg — ABNORMAL HIGH (ref 26.6–33.0)
MCHC: 33.3 g/dL (ref 31.5–35.7)
MCV: 101 fL — ABNORMAL HIGH (ref 79–97)
Platelets: 183 10*3/uL (ref 150–450)
RBC: 4.27 x10E6/uL (ref 4.14–5.80)
RDW: 12.7 % (ref 11.6–15.4)
WBC: 7 10*3/uL (ref 3.4–10.8)

## 2022-08-03 LAB — LIPID PANEL
Chol/HDL Ratio: 3 ratio (ref 0.0–5.0)
Cholesterol, Total: 154 mg/dL (ref 100–199)
HDL: 51 mg/dL (ref >39)
LDL Chol Calc (NIH): 69 mg/dL (ref 0–99)
Triglycerides: 206 mg/dL — ABNORMAL HIGH (ref 0–149)
VLDL Cholesterol Cal: 34 mg/dL (ref 5–40)

## 2022-08-03 LAB — HEMOGLOBIN A1C
Est. average glucose Bld gHb Est-mCnc: 114 mg/dL
Hgb A1c MFr Bld: 5.6 % (ref 4.8–5.6)

## 2022-08-03 NOTE — Addendum Note (Signed)
Addended by: Sharee Holster R on: 08/03/2022 08:20 AM   Modules accepted: Orders

## 2022-09-06 ENCOUNTER — Other Ambulatory Visit: Payer: Self-pay | Admitting: Interventional Cardiology

## 2022-09-06 ENCOUNTER — Other Ambulatory Visit: Payer: Self-pay | Admitting: Podiatry

## 2022-09-07 ENCOUNTER — Telehealth: Payer: Self-pay

## 2022-09-07 NOTE — Telephone Encounter (Signed)
**Note De-Identified Martin Sims Obfuscation** Martin Sims ([name]) PA Case ID #: [id number] Rx #: [id number] Outcome Approved today Your PA request has been approved.  Authorization Expiration Date: 09/05/2025 Drug Omega-3-acid Ethyl Esters 1GM capsules ePA cloud Psychologist, educational Electronic PA Form [phone] NCPDP) Original Claim Info 69 PRIOR AUTH REQ-MD CALL [phone].DRUG REQUIRES PRIOR AUTHORIZATION(PHARMACY HELP DESK [phone])  I have notified [id number] Ginette Otto, [place] - [address] (Ph: [phone]) of this approval [name] their VM as the pharmacy was closed/on a meal break.

## 2022-12-17 ENCOUNTER — Other Ambulatory Visit (HOSPITAL_COMMUNITY): Payer: Self-pay

## 2022-12-17 ENCOUNTER — Telehealth: Payer: Self-pay

## 2022-12-17 NOTE — Telephone Encounter (Signed)
Pharmacy Patient Advocate Encounter   Received notification from CoverMyMeds that prior authorization for LOVAZA is required/requested.   Insurance verification completed.   The patient is insured through [employer] .   Per test claim: PA submitted to [employer] via CoverMyMeds Key/confirmation #/EOC [id number] Status is pending

## 2022-12-17 NOTE — Telephone Encounter (Signed)
Pharmacy Patient Advocate Encounter  Received notification from [employer] that Prior Authorization for LOVAZA has been APPROVED from 11/17/22 to 12/17/23.[name] Kitchen  PA #/Case ID/Reference #: [id number]

## 2023-01-02 ENCOUNTER — Other Ambulatory Visit (HOSPITAL_BASED_OUTPATIENT_CLINIC_OR_DEPARTMENT_OTHER): Payer: Self-pay | Admitting: Family Medicine

## 2023-01-02 DIAGNOSIS — R109 Unspecified abdominal pain: Secondary | ICD-10-CM

## 2023-01-07 ENCOUNTER — Ambulatory Visit (HOSPITAL_BASED_OUTPATIENT_CLINIC_OR_DEPARTMENT_OTHER)
Admission: RE | Admit: 2023-01-07 | Discharge: 2023-01-07 | Disposition: A | Discharge status: Home / Self Care | Payer: Managed Care, Other (non HMO) | Source: Ambulatory Visit | Attending: Family Medicine | Admitting: Family Medicine

## 2023-01-07 DIAGNOSIS — R109 Unspecified abdominal pain: Secondary | ICD-10-CM | POA: Diagnosis present

## 2023-01-07 MED ORDER — IOHEXOL 300 MG/ML  SOLN
100.0000 mL | Freq: Once | INTRAMUSCULAR | Status: AC | PRN
  Administered 2023-01-07: 85 mL via INTRAVENOUS

## 2023-02-15 ENCOUNTER — Other Ambulatory Visit: Payer: Self-pay | Admitting: Interventional Cardiology

## 2023-08-04 NOTE — Progress Notes (Unsigned)
  Cardiology Office Note:  .   Date:  08/05/2023  ID:  Martin Sims, DOB [birth year], MRN [id number] PCP: Farris Has, MD  [facility] Providers Cardiologist:  Lance Muss, MD    History of Present Illness: [name] Kitchen    August 05, 2023  Martin Sims is a 63 y.o. male previous patient of Dr. Eldridge Dace  I am meeting him for the first time today hx of HTN, HLD   No CP ,  No dyspnea   Not much exercise ,  gets some exercise Retired as a Architect / Engineering geologist      ROS:    Studies Reviewed: [name] Kitchen   EKG Interpretation Date/Time:  Monday August 05 2023 11:32:30 EST Ventricular Rate:  74 PR Interval:  152 QRS Duration:  116 QT Interval:  420 QTC Calculation: 466 R Axis:   -83  Text Interpretation: Normal sinus rhythm Left axis deviation Right bundle branch block Inferior infarct , age undetermined No previous ECGs available Confirmed by Kristeen Miss [phone]) on 08/05/2023 11:43:00 AM      Risk Assessment/Calculations:             Physical Exam:   VS:  BP 136/84   Pulse 74   Ht 5\' 9"  (1.753 m)   Wt 187 lb (84.8 kg)   BMI 27.62 kg/m    Wt Readings from Last 3 Encounters:  08/05/23 187 lb (84.8 kg)  08/02/22 172 lb 9.6 oz (78.3 kg)  08/01/21 189 lb (85.7 kg)    GEN: Well nourished, well developed in no acute distress NECK: No JVD; No carotid bruits CARDIAC: RRR, no murmurs, rubs, gallops RESPIRATORY:  Clear to auscultation without rales, wheezing or rhonchi  ABDOMEN: Soft, non-tender, non-distended EXTREMITIES:  No edema; No deformity   ASSESSMENT AND PLAN: .    HTN:   BP is well controlled.   Continue losartan Cont current meds  2..  hyperlipidemia :  check labs today ,  continue crestor   Return in 1 year         Dispo: 1 year    Signed, Kristeen Miss, MD

## 2023-08-05 ENCOUNTER — Encounter: Payer: Self-pay | Admitting: Cardiovascular Disease

## 2023-08-05 ENCOUNTER — Ambulatory Visit: Payer: Managed Care, Other (non HMO) | Attending: Cardiovascular Disease | Admitting: Cardiovascular Disease

## 2023-08-05 VITALS — BP 136/84 | HR 74 | Ht 69.0 in | Wt 187.0 lb

## 2023-08-05 DIAGNOSIS — E782 Mixed hyperlipidemia: Secondary | ICD-10-CM | POA: Diagnosis not present

## 2023-08-05 DIAGNOSIS — I10 Essential (primary) hypertension: Secondary | ICD-10-CM

## 2023-08-05 NOTE — Patient Instructions (Signed)
 Lab Work: Lipids, ALT, BMET today If you have labs (blood work) drawn today and your tests are completely normal, you will receive your results only by: MyChart Message (if you have MyChart) OR A paper copy in the mail If you have any lab test that is abnormal or we need to change your treatment, we will call you to review the results.  Follow-Up: At [facility], you and your health needs are our priority.  As part of our continuing mission to provide you with exceptional heart care, we have created designated Provider Care Teams.  These Care Teams include your primary Cardiologist (physician) and Advanced Practice Providers (APPs -  Physician Assistants and Nurse Practitioners) who all work together to provide you with the care you need, when you need it.  Your next appointment:   1 year(s)  Provider:   Kristeen Miss, MD      1st Floor: - Lobby - Registration  - Pharmacy  - Lab - Cafe  2nd Floor: - PV Lab - Diagnostic Testing (echo, CT, nuclear med)  3rd Floor: - Vacant  4th Floor: - TCTS (cardiothoracic surgery) - AFib Clinic - Structural Heart Clinic - Vascular Surgery  - Vascular Ultrasound  5th Floor: - [facility] (general and EP) - Clinical Pharmacy for coumadin, hypertension, lipid, weight-loss medications, and med management appointments    Valet parking services will be available as well.

## 2023-08-06 LAB — BASIC METABOLIC PANEL
BUN/Creatinine Ratio: 11 (ref 10–24)
BUN: 10 mg/dL (ref 8–27)
CO2: 22 mmol/L (ref 20–29)
Calcium: 10 mg/dL (ref 8.6–10.2)
Chloride: 102 mmol/L (ref 96–106)
Creatinine, Ser: 0.92 mg/dL (ref 0.76–1.27)
Glucose: 115 mg/dL — ABNORMAL HIGH (ref 70–99)
Potassium: 4.5 mmol/L (ref 3.5–5.2)
Sodium: 142 mmol/L (ref 134–144)
eGFR: 93 mL/min/{1.73_m2} (ref >59)

## 2023-08-06 LAB — LIPID PANEL
Chol/HDL Ratio: 3.6 ratio (ref 0.0–5.0)
Cholesterol, Total: 211 mg/dL — ABNORMAL HIGH (ref 100–199)
HDL: 58 mg/dL (ref >39)
LDL Chol Calc (NIH): 129 mg/dL — ABNORMAL HIGH (ref 0–99)
Triglycerides: 137 mg/dL (ref 0–149)
VLDL Cholesterol Cal: 24 mg/dL (ref 5–40)

## 2023-08-06 LAB — ALT: ALT: 50 IU/L — ABNORMAL HIGH (ref 0–44)

## 2023-08-08 ENCOUNTER — Encounter: Payer: Self-pay | Admitting: Cardiovascular Disease

## 2023-08-08 ENCOUNTER — Telehealth: Payer: Self-pay

## 2023-08-08 NOTE — Telephone Encounter (Signed)
 Called and spoke with patient who states that he is still taking his Crestor 10mg  as it's prescribed, which is twice weekly. He did retire last year and states he's been much more sedentary since then and admits to drinking more than he has in the past. He wants to try to clean up his diet before changing medication or seeing lipid clinic. He's seeing PCP next month for his annual visit and will have them re-check his labs. If not improved, will let us know and will be fine with increasing dose on statin (as long as ALT allows).

## 2023-08-17 ENCOUNTER — Other Ambulatory Visit: Payer: Self-pay | Admitting: Interventional Cardiology

## 2023-08-26 ENCOUNTER — Other Ambulatory Visit: Payer: Self-pay | Admitting: Interventional Cardiology

## 2023-08-29 ENCOUNTER — Other Ambulatory Visit: Payer: Self-pay | Admitting: Interventional Cardiology

## 2023-12-09 ENCOUNTER — Other Ambulatory Visit: Payer: Self-pay | Admitting: Interventional Cardiology

## 2024-02-27 ENCOUNTER — Telehealth: Payer: Self-pay | Admitting: Pharmacy Technician

## 2024-02-27 NOTE — Telephone Encounter (Signed)
   Pharmacy Patient Advocate Encounter   Received notification from CoverMyMeds that prior authorization for omega-3 is required/requested.   Insurance verification completed.   The patient is insured through [employer] .   Per test claim: PA required; PA submitted to above mentioned insurance via Latent Key/confirmation #/[id number] Status is pending

## 2024-02-27 NOTE — Telephone Encounter (Signed)
 Pharmacy Patient Advocate Encounter  Received notification from [employer] that Prior Authorization for omega-3 has been APPROVED from 02/27/24 to 02/26/25   PA #/Case ID/Reference #: [id number]

## 2024-08-24 ENCOUNTER — Ambulatory Visit
